# Patient Record
Sex: Male | Born: 1970
Health system: Southern US, Community
[De-identification: ages and names within clinical notes are randomized; demographics above are authoritative.]

## PROBLEM LIST (undated history)

## (undated) DIAGNOSIS — E785 Hyperlipidemia, unspecified: Secondary | ICD-10-CM

## (undated) DIAGNOSIS — T7840XA Allergy, unspecified, initial encounter: Secondary | ICD-10-CM

## (undated) DIAGNOSIS — I10 Essential (primary) hypertension: Secondary | ICD-10-CM

## (undated) HISTORY — DX: Essential (primary) hypertension: I10

## (undated) HISTORY — PX: NECK SURGERY: SHX720

## (undated) HISTORY — PX: COLONOSCOPY: SHX174

## (undated) HISTORY — DX: Allergy, unspecified, initial encounter: T78.40XA

## (undated) HISTORY — DX: Hyperlipidemia, unspecified: E78.5

---

## 2002-07-28 ENCOUNTER — Encounter: Payer: Self-pay | Admitting: Emergency Medicine

## 2002-07-28 ENCOUNTER — Emergency Department (HOSPITAL_COMMUNITY): Admission: EM | Admit: 2002-07-28 | Discharge: 2002-07-28 | Payer: Self-pay | Admitting: Emergency Medicine

## 2002-12-17 ENCOUNTER — Emergency Department (HOSPITAL_COMMUNITY): Admission: EM | Admit: 2002-12-17 | Discharge: 2002-12-17 | Payer: Self-pay | Admitting: *Deleted

## 2003-03-28 ENCOUNTER — Encounter: Payer: Self-pay | Admitting: Gastroenterology

## 2003-03-28 ENCOUNTER — Encounter: Admission: RE | Admit: 2003-03-28 | Discharge: 2003-03-28 | Payer: Self-pay | Admitting: Gastroenterology

## 2003-05-13 ENCOUNTER — Emergency Department (HOSPITAL_COMMUNITY): Admission: EM | Admit: 2003-05-13 | Discharge: 2003-05-14 | Payer: Self-pay | Admitting: Emergency Medicine

## 2003-06-07 ENCOUNTER — Encounter: Payer: Self-pay | Admitting: Gastroenterology

## 2003-06-07 ENCOUNTER — Encounter: Admission: RE | Admit: 2003-06-07 | Discharge: 2003-06-07 | Payer: Self-pay | Admitting: Gastroenterology

## 2003-07-24 ENCOUNTER — Encounter: Payer: Self-pay | Admitting: Gastroenterology

## 2003-07-24 ENCOUNTER — Encounter: Admission: RE | Admit: 2003-07-24 | Discharge: 2003-07-24 | Payer: Self-pay | Admitting: Gastroenterology

## 2003-08-10 ENCOUNTER — Encounter (INDEPENDENT_AMBULATORY_CARE_PROVIDER_SITE_OTHER): Payer: Self-pay | Admitting: Specialist

## 2003-08-10 ENCOUNTER — Ambulatory Visit (HOSPITAL_COMMUNITY): Admission: RE | Admit: 2003-08-10 | Discharge: 2003-08-10 | Payer: Self-pay | Admitting: Gastroenterology

## 2004-06-20 ENCOUNTER — Encounter: Admission: RE | Admit: 2004-06-20 | Discharge: 2004-06-20 | Payer: Self-pay | Admitting: Family Medicine

## 2004-09-18 ENCOUNTER — Ambulatory Visit (HOSPITAL_COMMUNITY): Admission: RE | Admit: 2004-09-18 | Discharge: 2004-09-18 | Payer: Self-pay | Admitting: Cardiology

## 2006-08-18 ENCOUNTER — Emergency Department (HOSPITAL_COMMUNITY): Admission: EM | Admit: 2006-08-18 | Discharge: 2006-08-18 | Payer: Self-pay | Admitting: Family Medicine

## 2008-08-29 ENCOUNTER — Emergency Department (HOSPITAL_COMMUNITY): Admission: EM | Admit: 2008-08-29 | Discharge: 2008-08-29 | Payer: Self-pay | Admitting: Emergency Medicine

## 2009-04-07 ENCOUNTER — Emergency Department (HOSPITAL_COMMUNITY): Admission: EM | Admit: 2009-04-07 | Discharge: 2009-04-07 | Payer: Self-pay | Admitting: Emergency Medicine

## 2009-07-04 ENCOUNTER — Inpatient Hospital Stay (HOSPITAL_COMMUNITY): Admission: RE | Admit: 2009-07-04 | Discharge: 2009-07-06 | Payer: Self-pay | Admitting: Neurosurgery

## 2009-10-03 ENCOUNTER — Ambulatory Visit (HOSPITAL_COMMUNITY): Admission: RE | Admit: 2009-10-03 | Discharge: 2009-10-03 | Payer: Self-pay | Admitting: Family Medicine

## 2010-06-24 IMAGING — CR DG CERVICAL SPINE 2 OR 3 VIEWS
1 series · 1 of 1 positions shown · non-contrast
Comparison: None.

CLINICAL DATA: C5-6 and C6-7 spondylosis and myelopathy. ACDF.

INTRAOPERATIVE CERVICAL SPINE - 2-3 VIEW 07/04/2009:

[view not recorded]
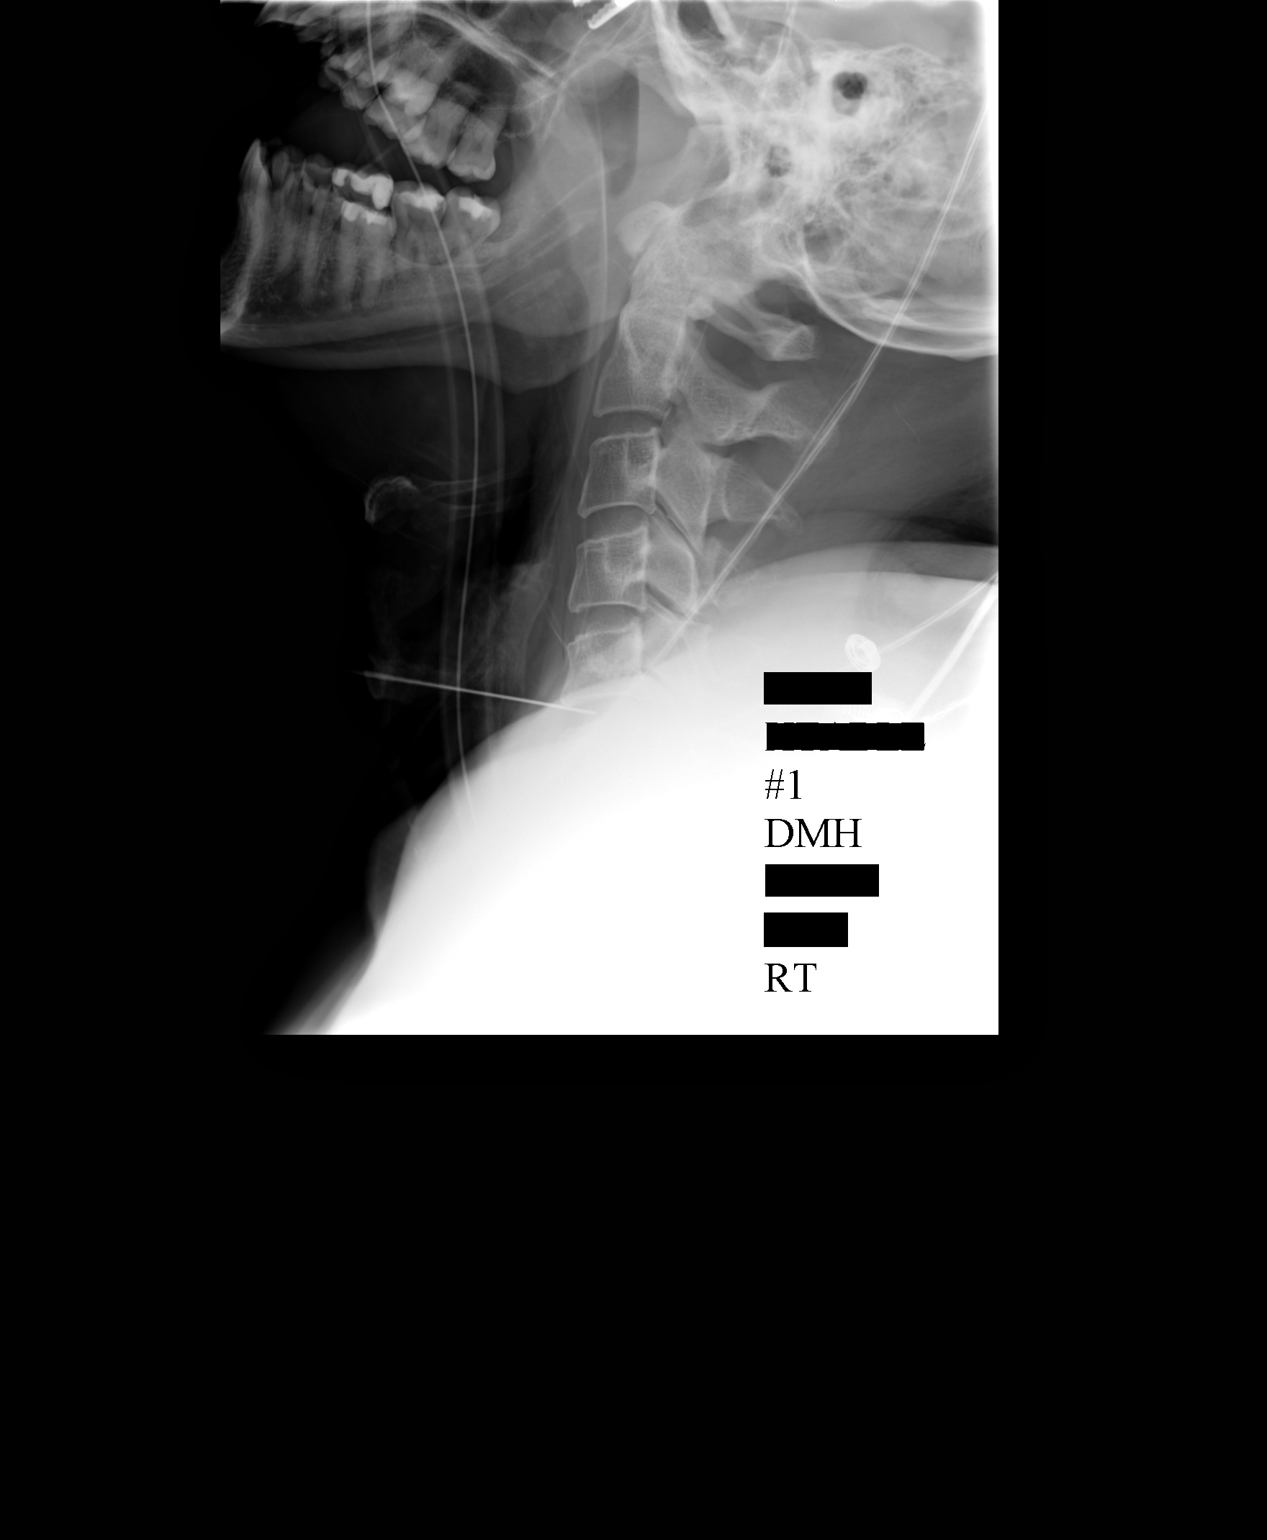

[1 of 1 positions shown; findings below may reference images not displayed]

FINDINGS: All images were submitted for interpretation
postoperatively.  Initial image obtained 1936 hours demonstrates
localizer needle projected over the C5-6 disc space.  The second
and third images obtained at 6764 and 5152 hours demonstrate ACDF
with hardware C5-C7.  Interbody fusion plugs appropriately
positioned in the disc spaces.
IMPRESSION: ACDF with hardware C5-C7.

## 2011-03-01 LAB — CBC
HCT: 46.6 % (ref 39.0–52.0)
MCHC: 35.1 g/dL (ref 30.0–36.0)
MCV: 89.6 fL (ref 78.0–100.0)
RBC: 5.2 MIL/uL (ref 4.22–5.81)

## 2011-03-04 LAB — POCT I-STAT, CHEM 8
HCT: 47 % (ref 39.0–52.0)
Hemoglobin: 16 g/dL (ref 13.0–17.0)
Potassium: 4 mEq/L (ref 3.5–5.1)
Sodium: 141 mEq/L (ref 135–145)
TCO2: 29 mmol/L (ref 0–100)

## 2011-03-04 LAB — DIFFERENTIAL
Basophils Absolute: 0 10*3/uL (ref 0.0–0.1)
Basophils Relative: 0 % (ref 0–1)
Eosinophils Absolute: 0 10*3/uL (ref 0.0–0.7)
Eosinophils Relative: 1 % (ref 0–5)
Lymphs Abs: 1.2 10*3/uL (ref 0.7–4.0)
Neutrophils Relative %: 76 % (ref 43–77)

## 2011-03-04 LAB — CBC
HCT: 47 % (ref 39.0–52.0)
MCHC: 34.7 g/dL (ref 30.0–36.0)
MCV: 89.1 fL (ref 78.0–100.0)
Platelets: 247 10*3/uL (ref 150–400)
RDW: 12.7 % (ref 11.5–15.5)
WBC: 7.3 10*3/uL (ref 4.0–10.5)

## 2011-03-04 LAB — POCT CARDIAC MARKERS
CKMB, poc: <1 ng/mL — ABNORMAL LOW (ref 1.0–8.0)
Myoglobin, poc: 60.7 ng/mL (ref 12–200)
Troponin i, poc: <0.05 ng/mL (ref 0.00–0.09)

## 2011-04-08 NOTE — Op Note (Signed)
NAMEIBRAHAM, LEVI              ACCOUNT NO.:  [id number]   MEDICAL RECORD NO.:  [id number]          PATIENT TYPE:  OIB   LOCATION:  3013                         FACILITY:  [facility]   PHYSICIAN:  Hilda Lias, M.D.   DATE OF BIRTH:  05-25-1971   DATE OF PROCEDURE:  07/04/2009  DATE OF DISCHARGE:                               OPERATIVE REPORT   PREOPERATIVE DIAGNOSES:  C5-C6, C6-C7 stenosis with early myelopathy,  chronic radiculopathy.   POSTOPERATIVE DIAGNOSES:  C5-C6, C6-C7 stenosis with early myelopathy,  chronic radiculopathy.   PROCEDURE:  1. Anterior C5-C6 and C6-C7 diskectomy.  2. Decompression of spinal cord.  3. Foraminotomy.  4. Interbody fusion with allograft and 7-mm with autograft plate.  5. Microscope.   SURGEON:  Hilda Lias, MD   ASSISTANT:  Clydene Fake, MD   CLINICAL HISTORY:  Mr. Hofmann is a 40 year old gentleman who had been  followed by me for 5 years with weakness and neck pain.  Lately, here  having some discomfort, pain in the arm, and difficulty walking.  The x-  rays shows stenosis at level of C5-6 and C6-7, and surgery was advised.   PROCEDURE:  The patient was taken to the OR and after intubation, the  left side of the neck was cleaned with DuraPrep.  Transverse incision  was made through the skin, subcutaneous tissue, platysma.  Dissection  was carried out all the way down to the cervical spine.  X-rays showed  that we were at the level of C5-6 then the anterior ligament of C5-6 and  C6-7 were opened and we brought the microscope in to the area.  At the  level of C5-6, we removed the disk.  We found that the patient had quite  a bit of osteophyte with some calcified posterior ligament.  Decompression of the ligament was done and then the spinal cord was  decompressed superoinferiorly and laterally into the foramina.  The same  procedure was done necessarily at the level of C5-6 and C6-7, and at the  end we have a good decompression of  the C6-7 nerve root.  The endplate  of C6-7 and C5-6 were trimmed.  We introduced two allografts with  autograft in size, 7 mm height, lordotic.  Then plate using serial  screws of 13 and 15 were inserted.  Lateral cervical spine showed good  position of the graft and the plate.  Hemostasis was accomplished with  bipolar.  At the end, we waited 10 minutes to make sure that we have a  complete hemostasis.  Once this was achieved, the wound was closed with  Vicryl and Steri-Strips.           ______________________________  Hilda Lias, M.D.     EB/MEDQ  D:  07/04/2009  T:  07/04/2009  Job:  [id number]

## 2011-04-08 NOTE — H&P (Signed)
Timothy Dean, Timothy Dean              ACCOUNT NO.:  [id number]   MEDICAL RECORD NO.:  [id number]          PATIENT TYPE:  OIB   LOCATION:  3013                         FACILITY:  [facility]   PHYSICIAN:  Hilda Lias, M.D.   DATE OF BIRTH:  [birth year]   DATE OF ADMISSION:  07/04/2009  DATE OF DISCHARGE:                              HISTORY & PHYSICAL   Mr. Timothy Dean is a gentleman who came complaining of neck pain  incoordination with the leg up to the point that he feels that both legs  are really heavy.  Pain and weakness in the upper extremity has started  with this.  This gentleman 4 years ago was seen by me, and I made a  diagnosis of cervical stenosis at the level C5-6, C6-7.  He declined  surgery, now he feeling that he is kind of worse.  He ended up in the  emergency room admitted with [place].   PAST MEDICAL HISTORY:  Negative.   ALLERGIES:  He is not allergic to any medications.   FAMILY HISTORY:  Unremarkable.   SOCIAL HISTORY:  Negative.   REVIEW OF SYSTEMS:  Positive for weakness in the arm, neck pain, and  back pain.   PHYSICAL EXAMINATION:  HEAD, EARS, NOSE, AND THROAT:  Normal.  NECK:  Normal.  LUNGS:  Clear.  HEART:  Heart sounds normal.  ABDOMEN:  Normal.  EXTREMITIES:  Normal pulses.  NEURO:  He has weakness in both biceps and both wrist extensors.  Reflexes are 2+.  No Babinski.   The cervical spine x-ray showed stenosis at the level C5-6, C6-7.  The  level C5-6 has progressed in relation to the one previous x-ray.   CLINICAL IMPRESSION:  C5-6, C6-7 stenosis with early myelopathy.   RECOMMENDATIONS:  The patient is being admitted for surgery.  Procedure  will be C5-6, C6-7 diskectomy and decompression.  He knows about the  risks such as infection, CSF leak, worsening pain, paralysis, need for  surgery.           ______________________________  Hilda Lias, M.D.     EB/MEDQ  D:  07/04/2009  T:  07/04/2009  Job:  [id number]

## 2011-04-11 NOTE — Op Note (Signed)
   NAME:  Timothy Dean, Timothy Dean                        ACCOUNT NO.:  [id number]   MEDICAL RECORD NO.:  [id number]                   PATIENT TYPE:  AMB   LOCATION:  ENDO                                 FACILITY:  [facility]   PHYSICIAN:  Petra Kuba, M.D.                 DATE OF BIRTH:  04-Sep-1971   DATE OF PROCEDURE:  08/10/2003  DATE OF DISCHARGE:                                 OPERATIVE REPORT   PROCEDURE PERFORMED:  Esophagogastroduodenoscopy with biopsy.   ENDOSCOPIST:  Petra Kuba, M.D.   INDICATIONS FOR PROCEDURE:  Patient with a history of ulcers and  Helicobacter pylori with abdominal pain not responding to the usual  medicines.  Consent was signed after the risks, benefits, methods and  options were thoroughly discussed in the office.   MEDICINES USED:  Demerol 70 mg, Versed 7 mg.   DESCRIPTION OF PROCEDURE:  The video endoscope was inserted by direct  vision.  Esophagus was normal.  Scope passed into the stomach and advanced  to the antrum.  Some very minimal antritis was seen and into a normal-  appearing duodenal bulb and around the C-loop to a normal-appearing second  portion of the duodenum.  We went ahead and took two or three biopsies of  the duodenum to rule out any microscopic abnormality.  Scope was withdrawn  back to the stomach which was evaluated on straight and retroflex  visualization.  Other than some minimal gastritis, no abnormalities were  seen including a good look at the cardia, fundus, angularis, lesser and  greater curve.  Two biopsies of the antrum, two of the proximal stomach were  obtained and put in a separate container to rule out Helicobacter pylori.  Air was suctioned, scope was slowly withdrawn.  Again, a good look at the  esophagus was normal.  Scope was removed.  The patient tolerated the  procedure well.  There were no obvious immediate complication.   ENDOSCOPIC DIAGNOSIS:  1. Minimal gastritis, status post biopsy.  2. Otherwise within  normal limits to the esophagogastroduodenoscopy, status     post duodenal biopsy as well.   PLAN:  Await pathology.  Consider laparoscope, consider Neurontin or  antidepressant.  Maybe resend him to Dr. Hal Hope to get her opinion of any  other work-up and plans and her take on the pain.  Possibly even repeat a CT  one more time.  I will see him back as needed or in six weeks to decide any  of the above.      MEM/MEDQ  D:  08/10/2003  T:  08/10/2003  Job:  [id number]   cc:   Clydie Braun L. Hal Hope, M.D.  [address] [place]  [place] [address]  Fax: [phone]

## 2011-08-25 LAB — POCT I-STAT, CHEM 8
BUN: 7
Calcium, Ion: 1.11 — ABNORMAL LOW
Chloride: 104
Creatinine, Ser: 0.9
Glucose, Bld: 122 — ABNORMAL HIGH
HCT: 43
Hemoglobin: 14.6
Potassium: 4.2
Sodium: 138
TCO2: 26

## 2011-08-25 LAB — GLUCOSE, CAPILLARY: Glucose-Capillary: 118 — ABNORMAL HIGH

## 2012-05-06 ENCOUNTER — Other Ambulatory Visit: Payer: Self-pay | Admitting: Family Medicine

## 2012-05-06 DIAGNOSIS — R1033 Periumbilical pain: Secondary | ICD-10-CM

## 2012-05-10 ENCOUNTER — Other Ambulatory Visit: Payer: Self-pay | Admitting: Family Medicine

## 2012-05-10 ENCOUNTER — Ambulatory Visit: Admission: RE | Admit: 2012-05-10 | Payer: 59 | Source: Ambulatory Visit

## 2012-05-10 ENCOUNTER — Ambulatory Visit
Admission: RE | Admit: 2012-05-10 | Discharge: 2012-05-10 | Disposition: A | Discharge status: Home / Self Care | Payer: 59 | Source: Ambulatory Visit | Attending: Family Medicine | Admitting: Family Medicine

## 2012-05-10 DIAGNOSIS — R1033 Periumbilical pain: Secondary | ICD-10-CM

## 2012-05-10 MED ORDER — IOHEXOL 300 MG/ML  SOLN
100.0000 mL | Freq: Once | INTRAMUSCULAR | Status: AC | PRN
  Administered 2012-05-10: 100 mL via INTRAVENOUS

## 2012-05-10 MED ORDER — IOHEXOL 300 MG/ML  SOLN
30.0000 mL | Freq: Once | INTRAMUSCULAR | Status: AC | PRN
  Administered 2012-05-10: 30 mL via ORAL

## 2013-03-24 ENCOUNTER — Ambulatory Visit (INDEPENDENT_AMBULATORY_CARE_PROVIDER_SITE_OTHER): Payer: 59 | Admitting: Family Medicine

## 2013-03-24 ENCOUNTER — Encounter: Payer: Self-pay | Admitting: Family Medicine

## 2013-03-24 VITALS — BP 160/90 | HR 90 | Temp 98.7°F | Resp 14 | Wt 182.0 lb

## 2013-03-24 DIAGNOSIS — N4889 Other specified disorders of penis: Secondary | ICD-10-CM

## 2013-03-24 DIAGNOSIS — R739 Hyperglycemia, unspecified: Secondary | ICD-10-CM

## 2013-03-24 DIAGNOSIS — N489 Disorder of penis, unspecified: Secondary | ICD-10-CM

## 2013-03-24 DIAGNOSIS — R7309 Other abnormal glucose: Secondary | ICD-10-CM

## 2013-03-24 DIAGNOSIS — R300 Dysuria: Secondary | ICD-10-CM

## 2013-03-24 LAB — WET PREP FOR TRICH, YEAST, CLUE
Clue Cells Wet Prep HPF POC: NONE SEEN
WBC, Wet Prep HPF POC: NONE SEEN
Yeast Wet Prep HPF POC: NONE SEEN

## 2013-03-24 LAB — BASIC METABOLIC PANEL
CO2: 25 mEq/L (ref 19–32)
Calcium: 9.3 mg/dL (ref 8.4–10.5)
Chloride: 107 mEq/L (ref 96–112)
Creat: 0.94 mg/dL (ref 0.50–1.35)
Sodium: 140 mEq/L (ref 135–145)

## 2013-03-24 LAB — HEMOGLOBIN A1C: Mean Plasma Glucose: 103 mg/dL (ref <117)

## 2013-03-24 NOTE — Progress Notes (Signed)
  Subjective:    Patient ID: Timothy Dean, male    DOB: 25-Dec-1970, 42 y.o.   MRN: [id number]  HPI Patient presents with pain and burning in his glans of the penis for over 1 year.  He does have some dysuria, but it burns and hurts even without voiding.  He denies penile discharge or rashes.  He has not been sexually active in years.  He denies any history of herpes.  There is no swelling, redness, or injury to the glans.  He also reports episodic blurred vision.  He denies diplopia.  He reports occasional floaters, wavy lines in his field of vision, and bright flashes of light in his field of vision.  Visual acuity is 20/15.  He has a history of hyperglycemia. No past medical history on file. No current outpatient prescriptions on file prior to visit.   No current facility-administered medications on file prior to visit.   No Known Allergies History   Social History  . Marital Status: Divorced    Spouse Name: N/A    Number of Children: N/A  . Years of Education: N/A   Occupational History  . Not on file.   Social History Main Topics  . Smoking status: Never Smoker   . Smokeless tobacco: Not on file  . Alcohol Use: No  . Drug Use: No  . Sexually Active: Not on file   Other Topics Concern  . Not on file   Social History Narrative  . No narrative on file        Review of Systems  All other systems reviewed and are negative.       Objective:   Physical Exam  Constitutional: He appears well-developed and well-nourished.  Cardiovascular: Normal rate, regular rhythm, normal heart sounds and intact distal pulses.  Exam reveals no gallop and no friction rub.   No murmur heard. Pulmonary/Chest: Effort normal and breath sounds normal. No respiratory distress. He has no wheezes. He has no rales. He exhibits no tenderness.  Abdominal: Soft. Bowel sounds are normal. He exhibits no distension. There is no tenderness. There is no rebound.  Genitourinary: Penis normal. No  penile tenderness.  Glans is normal.  There is no discharge, rahses, lesions, inguinal lymphadenopathy, or testicualr masses or pain on exam.        Assessment & Plan:  1. Dysuria Wet prep is negative.  Sent GC/CT.  U/A has been negative in past.  If GC/CT neg would consult urology for possible neuropathic pain. - GC/Chlamydia Probe Amp - WET PREP FOR TRICH, YEAST, CLUE  2. Penile pain See above. - GC/Chlamydia Probe Amp - WET PREP FOR TRICH, YEAST, CLUE  3. Hyperglycemia - Basic Metabolic Panel - Hemoglobin A1c If labs are normal, the differential for blurry vision is ocular migraines, macular degeneration, or PVD with retinal tears.  Recommended he consult his opthalmologist immediately.  He will.   I rechecked his BP at 140/90, told him to check daily and notify me in 1 week of the values.  He was very nervous on exam today.

## 2013-03-25 LAB — GC/CHLAMYDIA PROBE AMP: GC Probe RNA: NEGATIVE

## 2013-03-25 NOTE — Addendum Note (Signed)
Addended by: Lynnea Ferrier on: 03/25/2013 07:50 AM   Modules accepted: Orders

## 2014-04-07 ENCOUNTER — Ambulatory Visit (INDEPENDENT_AMBULATORY_CARE_PROVIDER_SITE_OTHER): Payer: 59 | Admitting: Family Medicine

## 2014-04-07 ENCOUNTER — Encounter: Payer: Self-pay | Admitting: Family Medicine

## 2014-04-07 VITALS — BP 130/80 | HR 100 | Temp 97.0°F | Resp 16 | Ht 70.5 in | Wt 181.0 lb

## 2014-04-07 DIAGNOSIS — R51 Headache: Secondary | ICD-10-CM

## 2014-04-07 DIAGNOSIS — H538 Other visual disturbances: Secondary | ICD-10-CM

## 2014-04-07 DIAGNOSIS — I10 Essential (primary) hypertension: Secondary | ICD-10-CM

## 2014-04-07 NOTE — Progress Notes (Signed)
   Subjective:    Patient ID: [name], male    DOB: May 18, 1971, 43 y.o.   MRN: [id number]  HPI Patient presents for a recheck of his blood pressure. His last office visit is found to have extremely elevated blood pressure greater than 160/90. He's been checking his blood pressure at home and finding it consistently greater than 140/90. He also complains of chronic dull left-sided headache which seems to be gradually worsening over the last 2 months. He is also having worsening blurry vision in his left eye. He was evaluated by an ophthalmologist who recommended glasses but did not see any pathology in the retina. He also has occasional vision changes in his right eye. The headache is nonpulsatile. He denies any photophobia or phonophobia. He does have some mild neck pain. No past medical history on file. No current outpatient prescriptions on file prior to visit.   No current facility-administered medications on file prior to visit.   No Known Allergies History   Social History  . Marital Status: Divorced    Spouse Name: N/A    Number of Children: N/A  . Years of Education: N/A   Occupational History  . Not on file.   Social History Main Topics  . Smoking status: Never Smoker   . Smokeless tobacco: Not on file  . Alcohol Use: No  . Drug Use: No  . Sexual Activity: Not on file   Other Topics Concern  . Not on file   Social History Narrative  . No narrative on file   No family history on file.    Review of Systems  All other systems reviewed and are negative.      Objective:   Physical Exam  Vitals reviewed. Constitutional: He is oriented to person, place, and time. He appears well-developed and well-nourished. No distress.  Eyes: Conjunctivae and EOM are normal. Pupils are equal, round, and reactive to light. Right eye exhibits no discharge. Left eye exhibits no discharge. No scleral icterus.  Neck: Neck supple.  Cardiovascular: Normal rate, regular rhythm  and normal heart sounds.   Pulmonary/Chest: Effort normal and breath sounds normal. No respiratory distress. He has no wheezes. He has no rales.  Abdominal: Soft. Bowel sounds are normal.  Neurological: He is alert and oriented to person, place, and time. He has normal reflexes. He displays normal reflexes. No cranial nerve deficit. He exhibits normal muscle tone. Coordination normal.  Skin: He is not diaphoretic.          Assessment & Plan:  1. HTN (hypertension) Begin bystolic 5 mg poqd and recheck in 2-3 weeks. 2. Headache(784.0) - CT Head W Contrast; Future  3. Blurry vision, left eye - CT Head W Contrast; Future  I believe the headache could be due to his blood pressure compounded by the blurry vision. I believe his blurry vision likely correct with glasses. I recommended he get the glasses his ophthalmologist recommended. However I will obtain a CT scan of the head to rule out a space occupying lesion inside the brain creating his blurry vision which is asymmetric and worsening along with the constant worsening heacache.

## 2014-04-10 ENCOUNTER — Other Ambulatory Visit: Payer: Self-pay | Admitting: Family Medicine

## 2014-04-10 DIAGNOSIS — R51 Headache: Secondary | ICD-10-CM

## 2014-04-10 DIAGNOSIS — H538 Other visual disturbances: Secondary | ICD-10-CM

## 2014-04-11 ENCOUNTER — Ambulatory Visit
Admission: RE | Admit: 2014-04-11 | Discharge: 2014-04-11 | Disposition: A | Discharge status: Home / Self Care | Payer: 59 | Source: Ambulatory Visit | Attending: Family Medicine | Admitting: Family Medicine

## 2014-04-11 DIAGNOSIS — H538 Other visual disturbances: Secondary | ICD-10-CM

## 2014-04-11 DIAGNOSIS — R51 Headache: Secondary | ICD-10-CM

## 2014-04-11 MED ORDER — IOHEXOL 300 MG/ML  SOLN
75.0000 mL | Freq: Once | INTRAMUSCULAR | Status: AC | PRN
  Administered 2014-04-11: 75 mL via INTRAVENOUS

## 2015-04-01 IMAGING — CT CT HEAD WO/W CM
1 of 2 series · 13 of 30 positions shown, 17 images · IV contrast (omnipaque)
Comparison: CT brain scan of 04/07/2009

CLINICAL DATA: Headache, blurred vision, lack of balance, no trauma

EXAM:
CT HEAD WITHOUT AND WITH CONTRAST
TECHNIQUE: Contiguous axial images were obtained from the base of the skull
through the vertex without and with intravenous contrast
CONTRAST:  75mL OMNIPAQUE IOHEXOL 300 MG/ML  SOLN

[Series 32: 3d filtered head w/o · axial · non-contrast · 0.46mm/px · z∈[+11,+156]mm · 13 of 32 slices shown, 17 images]
[im 3/32  brain]
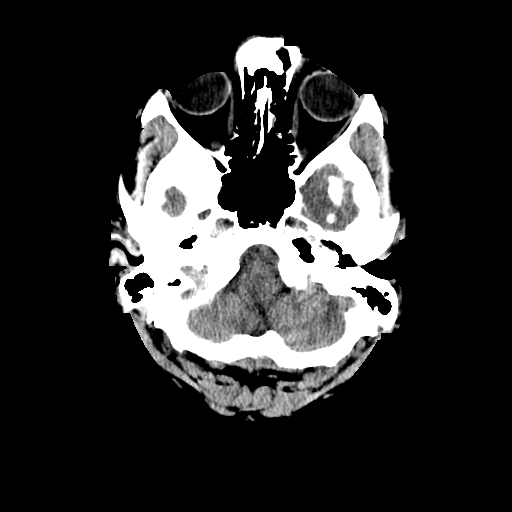
[im 3/32  bone]
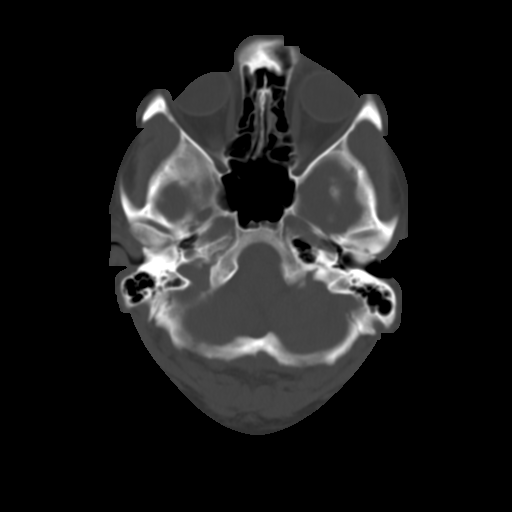
[im 5/32  brain]
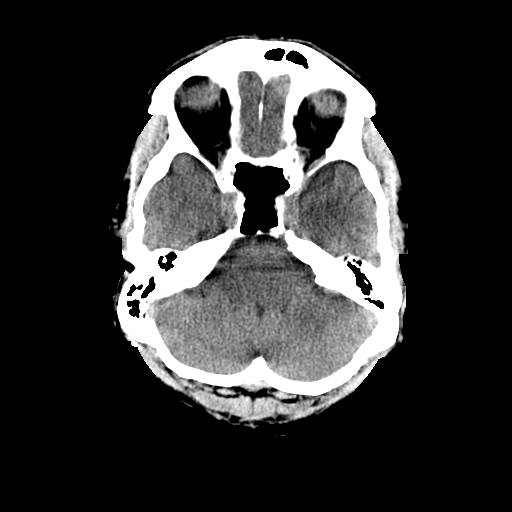
[im 7/32  brain]
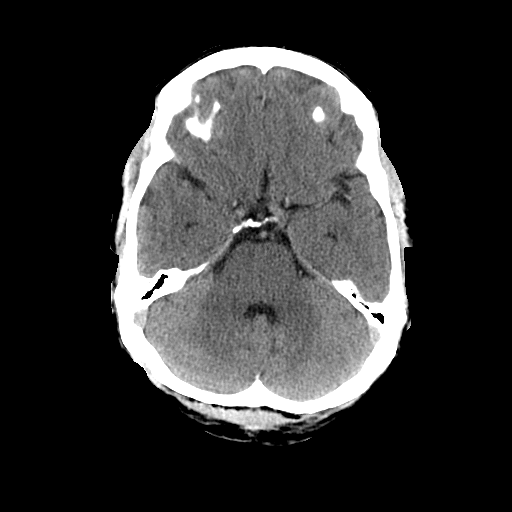
[im 9/32  brain]
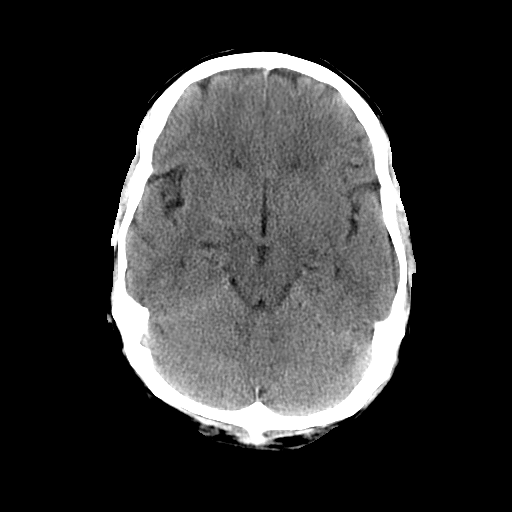
[im 12/32  brain]
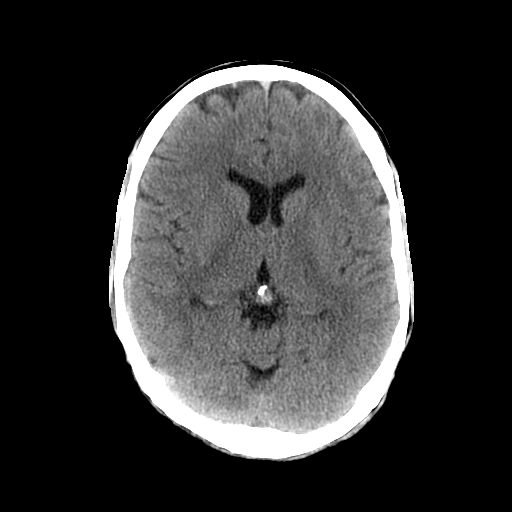
[im 12/32  bone]
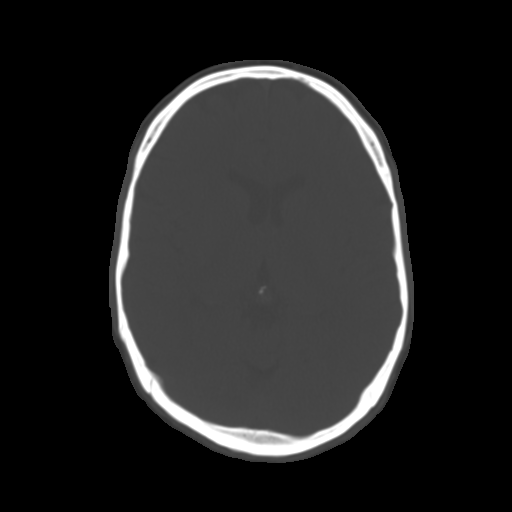
[im 14/32  brain]
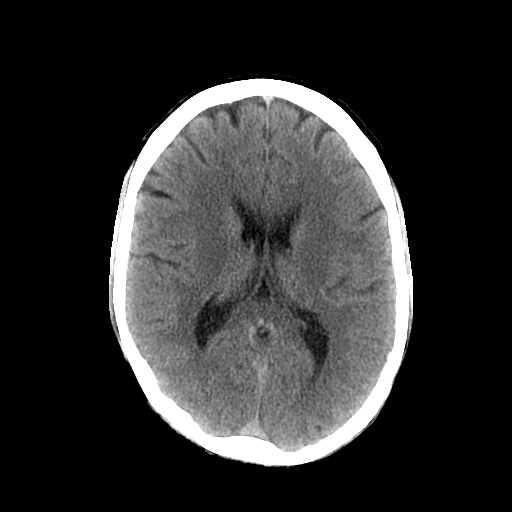
[im 16/32  brain]
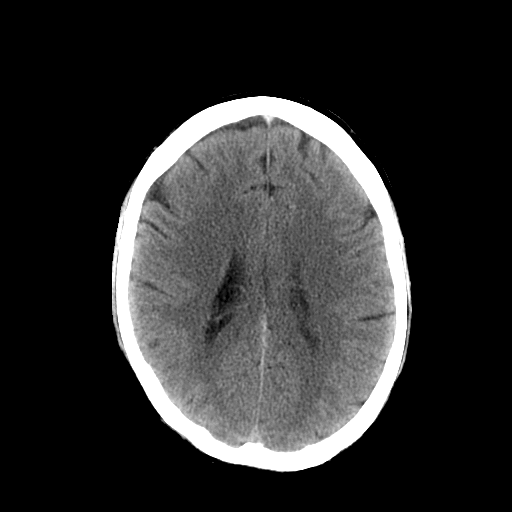
[im 18/32  brain]
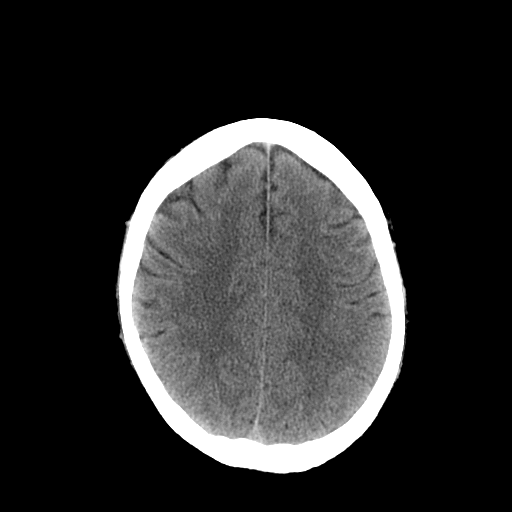
[im 20/32  brain]
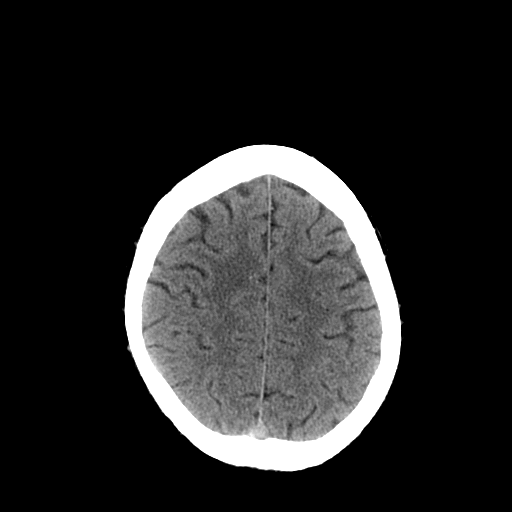
[im 20/32  bone]
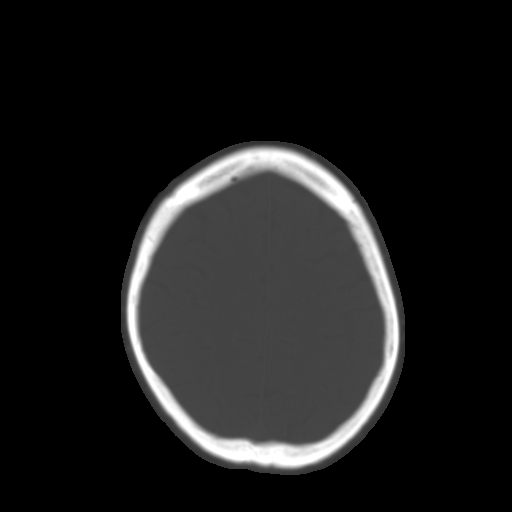
[im 23/32  brain]
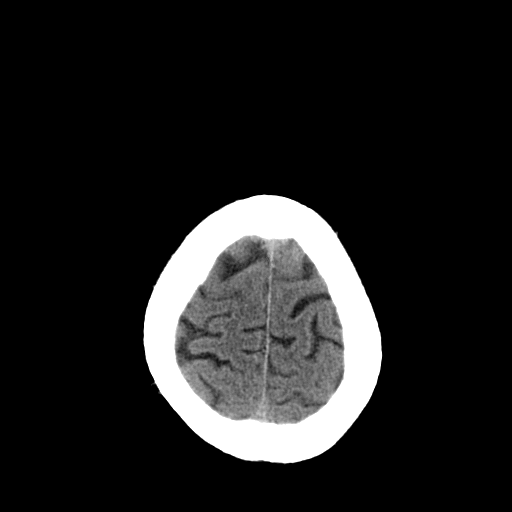
[im 25/32  brain]
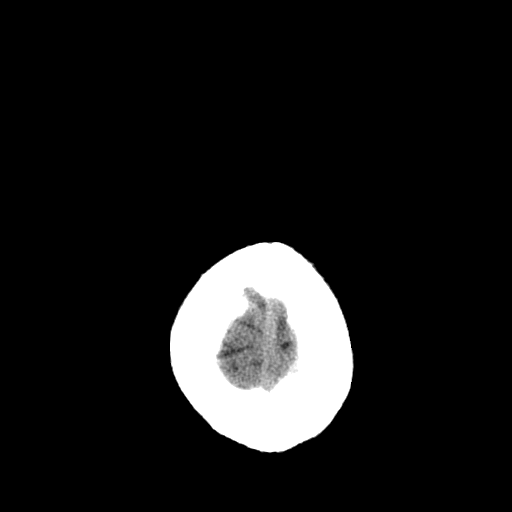
[im 27/32  brain]
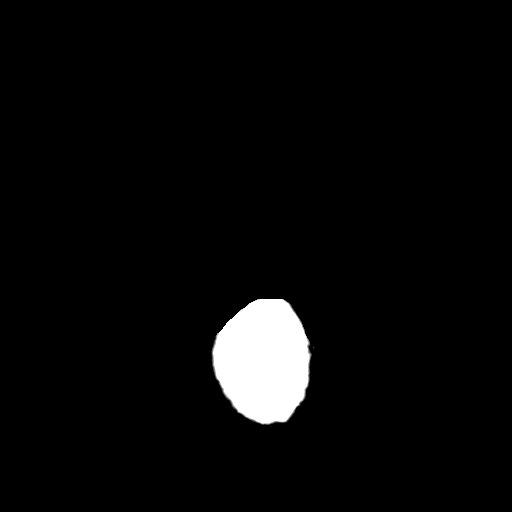
[im 29/32  brain]
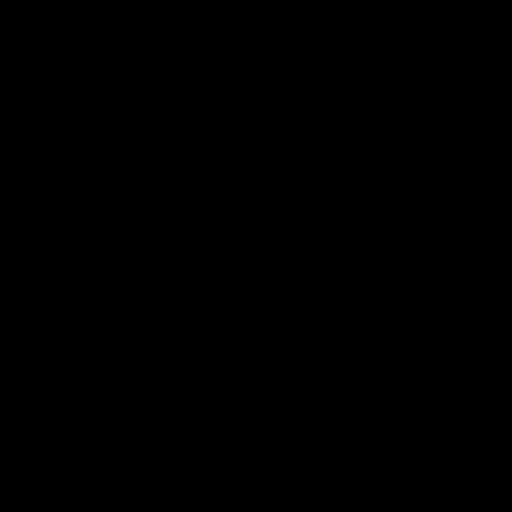
[im 29/32  bone]
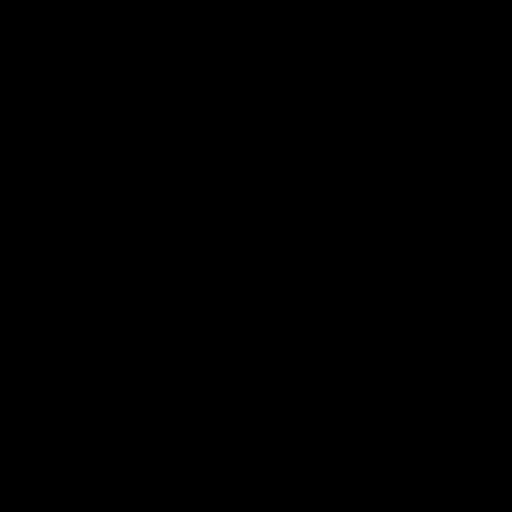

[13 of 30 positions shown; findings below may reference images not displayed]

FINDINGS: The ventricular system is stable in size and configuration, and the
septum is in a normal midline position. The fourth ventricle and
basilar cisterns appear normal. No mass, area of infarction, or
acute blood is seen. After contrast administration, no vascular
abnormality is seen. No enhancing lesion is noted. On bone window
images, no calvarial abnormality is seen. The paranasal sinuses are
pneumatized.
IMPRESSION: Negative CT of the brain.

## 2016-02-02 ENCOUNTER — Ambulatory Visit (INDEPENDENT_AMBULATORY_CARE_PROVIDER_SITE_OTHER): Payer: Commercial Managed Care - HMO | Admitting: Family Medicine

## 2016-02-02 VITALS — BP 152/100 | HR 122 | Temp 98.1°F | Resp 18 | Ht 69.0 in | Wt 194.4 lb

## 2016-02-02 DIAGNOSIS — I160 Hypertensive urgency: Secondary | ICD-10-CM

## 2016-02-02 LAB — POCT URINALYSIS DIP (MANUAL ENTRY)
BILIRUBIN UA: NEGATIVE
BILIRUBIN UA: NEGATIVE
Blood, UA: NEGATIVE
GLUCOSE UA: NEGATIVE
Leukocytes, UA: NEGATIVE
Nitrite, UA: NEGATIVE
Protein Ur, POC: NEGATIVE
SPEC GRAV UA: 1.015
Urobilinogen, UA: 0.2
pH, UA: 7

## 2016-02-02 LAB — BASIC METABOLIC PANEL
BUN: 10 mg/dL (ref 7–25)
CHLORIDE: 99 mmol/L (ref 98–110)
CO2: 25 mmol/L (ref 20–31)
Calcium: 9.8 mg/dL (ref 8.6–10.3)
Creat: 0.88 mg/dL (ref 0.60–1.35)
GLUCOSE: 99 mg/dL (ref 65–99)
POTASSIUM: 4.3 mmol/L (ref 3.5–5.3)
Sodium: 136 mmol/L (ref 135–146)

## 2016-02-02 MED ORDER — NEBIVOLOL HCL 5 MG PO TABS: 5.0000 mg | ORAL_TABLET | Freq: Every day | ORAL | Status: DC

## 2016-02-02 NOTE — Patient Instructions (Addendum)
IF you received an x-ray today, you will receive an invoice from [facility]. Please contact [facility] at [phone] with questions or concerns regarding your invoice.   IF you received labwork today, you will receive an invoice from [employer]. Please contact [facility] at [phone] with questions or concerns regarding your invoice.   Our billing staff will not be able to assist you with questions regarding bills from these companies.  You will be contacted with the lab results as soon as they are available. The fastest way to get your results is to activate your My Chart account. Instructions are located on the last page of this paperwork. If you have not heard from [place] regarding the results in 2 weeks, please contact this office.  DASH Eating Plan DASH stands for "Dietary Approaches to Stop Hypertension." The DASH eating plan is a healthy eating plan that has been shown to reduce high blood pressure (hypertension). Additional health benefits may include reducing the risk of type 2 diabetes mellitus, heart disease, and stroke. The DASH eating plan may also help with weight loss. WHAT DO I NEED TO KNOW ABOUT THE DASH EATING PLAN? For the DASH eating plan, you will follow these general guidelines:  Choose foods with a percent daily value for sodium of less than 5% (as listed on the food label).  Use salt-free seasonings or herbs instead of table salt or sea salt.  Check with your health care provider or pharmacist before using salt substitutes.  Eat lower-sodium products, often labeled as "lower sodium" or "no salt added."  Eat fresh foods.  Eat more vegetables, fruits, and low-fat dairy products.  Choose whole grains. Look for the word "whole" as the first word in the ingredient list.  Choose fish and skinless chicken or [place] more often than red meat. Limit fish, poultry, and meat to 6 oz (170 g) each day.  Limit sweets, desserts,  sugars, and sugary drinks.  Choose heart-healthy fats.  Limit cheese to 1 oz (28 g) per day.  Eat more home-cooked food and less restaurant, buffet, and fast food.  Limit fried foods.  Cook foods using methods other than frying.  Limit canned vegetables. If you do use them, rinse them well to decrease the sodium.  When eating at a restaurant, ask that your food be prepared with less salt, or no salt if possible. WHAT FOODS CAN I EAT? Seek help from a dietitian for individual calorie needs. Grains Whole grain or whole wheat bread. Brown rice. Whole grain or whole wheat pasta. Quinoa, bulgur, and whole grain cereals. Low-sodium cereals. Corn or whole wheat flour tortillas. Whole grain cornbread. Whole grain crackers. Low-sodium crackers. Vegetables Fresh or frozen vegetables (raw, steamed, roasted, or grilled). Low-sodium or reduced-sodium tomato and vegetable juices. Low-sodium or reduced-sodium tomato sauce and paste. Low-sodium or reduced-sodium canned vegetables.  Fruits All fresh, canned (in natural juice), or frozen fruits. Meat and Other Protein Products Ground beef (85% or leaner), grass-fed beef, or beef trimmed of fat. Skinless chicken or [place]. Ground chicken or [place]. Pork trimmed of fat. All fish and seafood. Eggs. Dried beans, peas, or lentils. Unsalted nuts and seeds. Unsalted canned beans. Dairy Low-fat dairy products, such as skim or 1% milk, 2% or reduced-fat cheeses, low-fat ricotta or cottage cheese, or plain low-fat yogurt. Low-sodium or reduced-sodium cheeses. Fats and Oils Tub margarines without trans fats. Light or reduced-fat mayonnaise and salad dressings (reduced sodium). Avocado. Safflower, olive, or canola oils. Natural peanut or almond butter.  Other Unsalted popcorn and pretzels. The items listed above may not be a complete list of recommended foods or beverages. Contact your dietitian for more options. WHAT FOODS ARE NOT RECOMMENDED? Grains White  bread. White pasta. White rice. Refined cornbread. Bagels and croissants. Crackers that contain trans fat. Vegetables Creamed or fried vegetables. Vegetables in a cheese sauce. Regular canned vegetables. Regular canned tomato sauce and paste. Regular tomato and vegetable juices. Fruits Dried fruits. Canned fruit in light or heavy syrup. Fruit juice. Meat and Other Protein Products Fatty cuts of meat. Ribs, chicken wings, bacon, sausage, bologna, salami, chitterlings, fatback, hot dogs, bratwurst, and packaged luncheon meats. Salted nuts and seeds. Canned beans with salt. Dairy Whole or 2% milk, cream, half-and-half, and cream cheese. Whole-fat or sweetened yogurt. Full-fat cheeses or blue cheese. Nondairy creamers and whipped toppings. Processed cheese, cheese spreads, or cheese curds. Condiments Onion and garlic salt, seasoned salt, table salt, and sea salt. Canned and packaged gravies. Worcestershire sauce. Tartar sauce. Barbecue sauce. Teriyaki sauce. Soy sauce, including reduced sodium. Steak sauce. Fish sauce. Oyster sauce. Cocktail sauce. Horseradish. Ketchup and mustard. Meat flavorings and tenderizers. Bouillon cubes. Hot sauce. Tabasco sauce. Marinades. Taco seasonings. Relishes. Fats and Oils Butter, stick margarine, lard, shortening, ghee, and bacon fat. Coconut, palm kernel, or palm oils. Regular salad dressings. Other Pickles and olives. Salted popcorn and pretzels. The items listed above may not be a complete list of foods and beverages to avoid. Contact your dietitian for more information. WHERE CAN I FIND MORE INFORMATION? National Heart, Lung, and Blood Institute: travelstabloid.com   This information is not intended to replace advice given to you by your health care provider. Make sure you discuss any questions you have with your health care provider.   Document Released: 10/30/2011 Document Revised: 12/01/2014 Document Reviewed:  09/14/2013 Elsevier Interactive Patient Education 2016 [place] Your High Blood Pressure Blood pressure is a measurement of how forceful your blood is pressing against the walls of the arteries. Arteries are muscular tubes within the circulatory system. Blood pressure does not stay the same. Blood pressure rises when you are active, excited, or nervous; and it lowers during sleep and relaxation. If the numbers measuring your blood pressure stay above normal most of the time, you are at risk for health problems. High blood pressure (hypertension) is a long-term (chronic) condition in which blood pressure is elevated. A blood pressure reading is recorded as two numbers, such as 120 over 80 (or 120/80). The first, higher number is called the systolic pressure. It is a measure of the pressure in your arteries as the heart beats. The second, lower number is called the diastolic pressure. It is a measure of the pressure in your arteries as the heart relaxes between beats.  Keeping your blood pressure in a normal range is important to your overall health and prevention of health problems, such as heart disease and stroke. When your blood pressure is uncontrolled, your heart has to work harder than normal. High blood pressure is a very common condition in adults because blood pressure tends to rise with age. Men and women are equally likely to have hypertension but at different times in life. Before age 84, men are more likely to have hypertension. After 46 years of age, women are more likely to have it. Hypertension is especially common in African Americans. This condition often has no signs or symptoms. The cause of the condition is usually not known. Your caregiver can help you come up with  a plan to keep your blood pressure in a normal, healthy range. BLOOD PRESSURE STAGES Blood pressure is classified into four stages: normal, prehypertension, stage 1, and stage 2. Your blood pressure reading will  be used to determine what type of treatment, if any, is necessary. Appropriate treatment options are tied to these four stages:  Normal  Systolic pressure (mm Hg): below 120.  Diastolic pressure (mm Hg): below 80. Prehypertension  Systolic pressure (mm Hg): 120 to 139.  Diastolic pressure (mm Hg): 80 to 89. Stage1  Systolic pressure (mm Hg): 140 to 159.  Diastolic pressure (mm Hg): 90 to 99. Stage2  Systolic pressure (mm Hg): 160 or above.  Diastolic pressure (mm Hg): 100 or above. RISKS RELATED TO HIGH BLOOD PRESSURE Managing your blood pressure is an important responsibility. Uncontrolled high blood pressure can lead to:  A heart attack.  A stroke.  A weakened blood vessel (aneurysm).  Heart failure.  Kidney damage.  Eye damage.  Metabolic syndrome.  Memory and concentration problems. HOW TO MANAGE YOUR BLOOD PRESSURE Blood pressure can be managed effectively with lifestyle changes and medicines (if needed). Your caregiver will help you come up with a plan to bring your blood pressure within a normal range. Your plan should include the following: Education  Read all information provided by your caregivers about how to control blood pressure.  Educate yourself on the latest guidelines and treatment recommendations. New research is always being done to further define the risks and treatments for high blood pressure. Lifestylechanges  Control your weight.  Avoid smoking.  Stay physically active.  Reduce the amount of salt in your diet.  Reduce stress.  Control any chronic conditions, such as high cholesterol or diabetes.  Reduce your alcohol intake. Medicines  Several medicines (antihypertensive medicines) are available, if needed, to bring blood pressure within a normal range. Communication  Review all the medicines you take with your caregiver because there may be side effects or interactions.  Talk with your caregiver about your diet, exercise  habits, and other lifestyle factors that may be contributing to high blood pressure.  See your caregiver regularly. Your caregiver can help you create and adjust your plan for managing high blood pressure. RECOMMENDATIONS FOR TREATMENT AND FOLLOW-UP  The following recommendations are based on current guidelines for managing high blood pressure in nonpregnant adults. Use these recommendations to identify the proper follow-up period or treatment option based on your blood pressure reading. You can discuss these options with your caregiver.  Systolic pressure of 123456 to [id number] or diastolic pressure of 80 to 89: Follow up with your caregiver as directed.  Systolic pressure of XX123456 to 0000000 or diastolic pressure of 90 to 100: Follow up with your caregiver within 2 months.  Systolic pressure above 0000000 or diastolic pressure above 123XX123: Follow up with your caregiver within 1 month.  Systolic pressure above 99991111 or diastolic pressure above A999333: Consider antihypertensive therapy; follow up with your caregiver within 1 week.  Systolic pressure above A999333 or diastolic pressure above 123456: Begin antihypertensive therapy; follow up with your caregiver within 1 week.   This information is not intended to replace advice given to you by your health care provider. Make sure you discuss any questions you have with your health care provider.   Document Released: 08/04/2012 Document Reviewed: 08/04/2012 Elsevier Interactive Patient Education [employer].

## 2016-02-02 NOTE — Progress Notes (Signed)
Subjective:   By signing my name below, I, Timothy Dean, attest that this documentation has been prepared under the direction and in the presence of Delman Cheadle, MD.  Electronically Signed: Thea Alken, ED Scribe. 02/02/2016. 3:41 PM.   Patient ID: Timothy Dean, male    DOB: 01/06/1971, 45 y.o.   MRN: [id number]  HPI Chief Complaint  Patient presents with  . Hypertension    noticed daily headaches all week & felt sluggish. Check BP this morning and it was 168/107 this morning (had a headache at the time), 155/101 about 30 min later    HPI Comments: Timothy Dean is a 45 y.o. male who presents to the [facility] complaining of headaches that began a couple weeks ago. He reports associated blurry vision noticed this week. He has a BP cuff at home and when he checked his BP this morning it was elevated. He states he has been borderline hypertensive in the past but was never put on medication. Pt has been taking advil every other day for a couple weeks for HA. He was prescribed glasses in the past but does not wear them.  He drinks about 4 sodas a day but has not had any today. He denies heart fluttering or racing, CP, leg swelling, trouble breathing and urinary symptoms.    Pt was last seen 2 years by PCP for similar symptoms. BP at that time was 160/90 with headache. HR 100. He was started on bystolic 5mg  and advised to recheck in 2 weeks. Recommended to start wearing glasses that were prescribed. CT of head due to headaches which was normal and renal function os normal   There are no active problems to display for this patient.  No past medical history on file. Past Surgical History  Procedure Laterality Date  . Neck surgery     No Known Allergies Prior to Admission medications   Not on File   Social History   Social History  . Marital Status: Divorced    Spouse Name: N/A  . Number of Children: N/A  . Years of Education: N/A   Occupational History  . Not  on file.   Social History Main Topics  . Smoking status: Never Smoker   . Smokeless tobacco: Not on file  . Alcohol Use: No  . Drug Use: No  . Sexual Activity: Not on file   Other Topics Concern  . Not on file   Social History Narrative   Review of Systems  Constitutional: Positive for fatigue.  Eyes: Positive for visual disturbance.  Respiratory: Negative for apnea and shortness of breath.   Cardiovascular: Negative for chest pain, palpitations and leg swelling.  Genitourinary: Negative for dysuria, urgency, frequency and difficulty urinating.  Neurological: Positive for headaches.    Objective:   Physical Exam  Constitutional: He is oriented to person, place, and time. He appears well-developed and well-nourished. No distress.  HENT:  Head: Normocephalic and atraumatic.  Eyes: Conjunctivae and EOM are normal.  Neck: Neck supple.  Cardiovascular: Regular rhythm, S1 normal and S2 normal.  Tachycardia present.   No murmur heard. Pulmonary/Chest: Effort normal.  Musculoskeletal: Normal range of motion.  Neurological: He is alert and oriented to person, place, and time.  Skin: Skin is warm and dry.  Psychiatric: He has a normal mood and affect. His behavior is normal.  Nursing note and vitals reviewed.  Filed Vitals:   02/02/16 1449  BP: 152/100  Pulse: 122  Temp:  98.1 F (36.7 C)  TempSrc: Oral  Resp: 18  Height: 5\' 9"  (1.753 m)  Weight: 194 lb 6 oz (88.168 kg)  SpO2: 98%    Results for orders placed or performed in visit on 02/02/16  POCT urinalysis dipstick  Result Value Ref Range   Color, UA yellow yellow   Clarity, UA clear clear   Glucose, UA negative negative   Bilirubin, UA negative negative   Ketones, POC UA negative negative   Spec Grav, UA 1.015    Blood, UA negative negative   pH, UA 7.0    Protein Ur, POC negative negative   Urobilinogen, UA 0.2    Nitrite, UA Negative Negative   Leukocytes, UA Negative Negative     Visual Acuity  Screening   Right eye Left eye Both eyes  Without correction: 20/30 20/20 20/20   With correction:      EKG- sinus tachycardia. No acute ischemic change.  Assessment & Plan:   1. Hypertensive urgency   No side effects to bystolic prior and was effective so will retry - pt encouraged to make tlc changes to avoid the need for additional agents.  Orders Placed This Encounter  Procedures  . Basic metabolic panel    Order Specific Question:  Has the patient fasted?    Answer:  No  . POCT urinalysis dipstick  . EKG 12-Lead    Meds ordered this encounter  Medications  . nebivolol (BYSTOLIC) 5 MG tablet    Sig: Take 1 tablet (5 mg total) by mouth daily.    Dispense:  30 tablet    Refill:  0    I personally performed the services described in this documentation, which was scribed in my presence. The recorded information has been reviewed and considered, and addended by me as needed.  Delman Cheadle, MD MPH

## 2016-02-04 ENCOUNTER — Encounter: Payer: Self-pay | Admitting: Family Medicine

## 2016-03-03 ENCOUNTER — Encounter: Payer: Self-pay | Admitting: Family Medicine

## 2016-03-03 ENCOUNTER — Ambulatory Visit (INDEPENDENT_AMBULATORY_CARE_PROVIDER_SITE_OTHER): Payer: Commercial Managed Care - HMO | Admitting: Family Medicine

## 2016-03-03 VITALS — BP 118/80 | HR 82 | Temp 98.2°F | Resp 18 | Ht 70.5 in | Wt 187.0 lb

## 2016-03-03 DIAGNOSIS — I10 Essential (primary) hypertension: Secondary | ICD-10-CM | POA: Diagnosis not present

## 2016-03-03 MED ORDER — NEBIVOLOL HCL 5 MG PO TABS: 5.0000 mg | ORAL_TABLET | Freq: Every day | ORAL | Status: DC

## 2016-03-03 NOTE — Progress Notes (Signed)
Subjective:    Patient ID: Timothy Dean, male    DOB: 1971/06/21, 45 y.o.   MRN: [id number]  HPI 03/2014 Patient presents for a recheck of his blood pressure. His last office visit is found to have extremely elevated blood pressure greater than 160/90. He's been checking his blood pressure at home and finding it consistently greater than 140/90. He also complains of chronic dull left-sided headache which seems to be gradually worsening over the last 2 months. He is also having worsening blurry vision in his left eye. He was evaluated by an ophthalmologist who recommended glasses but did not see any pathology in the retina. He also has occasional vision changes in his right eye. The headache is nonpulsatile. He denies any photophobia or phonophobia. He does have some mild neck pain.  At that time, my plan was: 1. HTN (hypertension) Begin bystolic 5 mg poqd and recheck in 2-3 weeks. 2. Headache(784.0) - CT Head W Contrast; Future  3. Blurry vision, left eye - CT Head W Contrast; Future  I believe the headache could be due to his blood pressure compounded by the blurry vision. I believe his blurry vision likely correct with glasses. I recommended he get the glasses his ophthalmologist recommended. However I will obtain a CT scan of the head to rule out a space occupying lesion inside the brain creating his blurry vision which is asymmetric and worsening along with the constant worsening heacache.  03/03/16 Patient has not been seen since. He is here today for recheck. Was recently seen in urgent care for hypertensive urgency by Dr. Brigitte Pulse who recommended he restart bystolic.  BP there was 150/100.  Heart rate was also elevated at >120 bpm.  Since resuming the medication, his blood pressure has been averaging 130/80-85. He denies any side effects on the medication. Today his blood pressures well controlled. His headache has improved. He continues to have blurry vision in his left eye that he is still  not seen the eye doctor for his glasses. He is long overdue for a complete physical exam. No past medical history on file. Current Outpatient Prescriptions on File Prior to Visit  Medication Sig Dispense Refill  . nebivolol (BYSTOLIC) 5 MG tablet Take 1 tablet (5 mg total) by mouth daily. 30 tablet 0   No current facility-administered medications on file prior to visit.   No Known Allergies Social History   Social History  . Marital Status: Divorced    Spouse Name: N/A  . Number of Children: N/A  . Years of Education: N/A   Occupational History  . Not on file.   Social History Main Topics  . Smoking status: Never Smoker   . Smokeless tobacco: Not on file  . Alcohol Use: No  . Drug Use: No  . Sexual Activity: Not on file   Other Topics Concern  . Not on file   Social History Narrative   Family History  Problem Relation Age of Onset  . Diabetes Father   . Hyperlipidemia Father       Review of Systems  All other systems reviewed and are negative.      Objective:   Physical Exam  Constitutional: He is oriented to person, place, and time. He appears well-developed and well-nourished. No distress.  Eyes: Conjunctivae and EOM are normal. Pupils are equal, round, and reactive to light. Right eye exhibits no discharge. Left eye exhibits no discharge. No scleral icterus.  Neck: Neck supple.  Cardiovascular: Normal rate, regular  rhythm and normal heart sounds.   Pulmonary/Chest: Effort normal and breath sounds normal. No respiratory distress. He has no wheezes. He has no rales.  Abdominal: Soft. Bowel sounds are normal.  Neurological: He is alert and oriented to person, place, and time. He has normal reflexes. No cranial nerve deficit. He exhibits normal muscle tone. Coordination normal.  Skin: He is not diaphoretic.  Vitals reviewed.         Assessment & Plan:  Essential hypertension - Plan: nebivolol (BYSTOLIC) 5 MG tablet  Continue bystolic 5 mg a day. I gave  the patient to read a car to make his prescription $30 for 3 months. I would like the patient to schedule a complete physical exam as soon as possible so that we may check a CBC, CMP, fasting lipid panel and perform a thorough physical exam. However unfortunately his blood pressure now is much better. Also strongly encouraged the patient follow-up with ophthalmologist as recommended 2 years ago.

## 2016-03-11 ENCOUNTER — Ambulatory Visit: Payer: Commercial Managed Care - HMO

## 2016-03-11 ENCOUNTER — Other Ambulatory Visit: Payer: Self-pay | Admitting: Family Medicine

## 2016-03-11 DIAGNOSIS — I10 Essential (primary) hypertension: Secondary | ICD-10-CM

## 2016-03-11 DIAGNOSIS — Z79899 Other long term (current) drug therapy: Secondary | ICD-10-CM

## 2016-03-11 LAB — LIPID PANEL
CHOLESTEROL: 172 mg/dL (ref 125–200)
HDL: 34 mg/dL — AB (ref >=40)
LDL Cholesterol: 114 mg/dL (ref <130)
TRIGLYCERIDES: 120 mg/dL (ref <150)
Total CHOL/HDL Ratio: 5.1 Ratio — ABNORMAL HIGH (ref <=5.0)
VLDL: 24 mg/dL (ref <30)

## 2016-03-11 LAB — CBC WITH DIFFERENTIAL/PLATELET
BASOS ABS: 52 {cells}/uL (ref 0–200)
Basophils Relative: 1 %
EOS ABS: 156 {cells}/uL (ref 15–500)
Eosinophils Relative: 3 %
HEMATOCRIT: 47.7 % (ref 38.5–50.0)
HEMOGLOBIN: 16 g/dL (ref 13.0–17.0)
LYMPHS ABS: 1612 {cells}/uL (ref 850–3900)
Lymphocytes Relative: 31 %
MCH: 30 pg (ref 27.0–33.0)
MCHC: 33.5 g/dL (ref 32.0–36.0)
MCV: 89.3 fL (ref 80.0–100.0)
MONO ABS: 572 {cells}/uL (ref 200–950)
MPV: 8.6 fL (ref 7.5–12.5)
Monocytes Relative: 11 %
NEUTROS PCT: 54 %
Neutro Abs: 2808 cells/uL (ref 1500–7800)
Platelets: 245 10*3/uL (ref 140–400)
RBC: 5.34 MIL/uL (ref 4.20–5.80)
RDW: 13.8 % (ref 11.0–15.0)
WBC: 5.2 10*3/uL (ref 3.8–10.8)

## 2016-03-11 LAB — COMPLETE METABOLIC PANEL WITH GFR
ALBUMIN: 4.5 g/dL (ref 3.6–5.1)
ALK PHOS: 66 U/L (ref 40–115)
ALT: 18 U/L (ref 9–46)
AST: 21 U/L (ref 10–40)
BILIRUBIN TOTAL: 0.6 mg/dL (ref 0.2–1.2)
BUN: 14 mg/dL (ref 7–25)
CO2: 27 mmol/L (ref 20–31)
Calcium: 9 mg/dL (ref 8.6–10.3)
Chloride: 104 mmol/L (ref 98–110)
Creat: 0.92 mg/dL (ref 0.60–1.35)
GFR, Est African American: >89 mL/min (ref >=60)
Glucose, Bld: 98 mg/dL (ref 70–99)
POTASSIUM: 3.9 mmol/L (ref 3.5–5.3)
SODIUM: 141 mmol/L (ref 135–146)
Total Protein: 7.2 g/dL (ref 6.1–8.1)

## 2016-03-13 ENCOUNTER — Encounter: Payer: Self-pay | Admitting: *Deleted

## 2016-10-06 ENCOUNTER — Encounter: Payer: Self-pay | Admitting: Family Medicine

## 2016-10-06 ENCOUNTER — Ambulatory Visit (INDEPENDENT_AMBULATORY_CARE_PROVIDER_SITE_OTHER): Payer: Commercial Managed Care - HMO | Admitting: Family Medicine

## 2016-10-06 DIAGNOSIS — I10 Essential (primary) hypertension: Secondary | ICD-10-CM | POA: Diagnosis not present

## 2016-10-06 MED ORDER — NEBIVOLOL HCL 10 MG PO TABS: 10.0000 mg | ORAL_TABLET | Freq: Every day | ORAL | 3 refills | Status: DC

## 2016-10-06 NOTE — Progress Notes (Signed)
   Subjective:    Patient ID: Timothy Dean, male    DOB: [birth year], 45 y.o.   MRN: [id number]  Patient presents for Blood Pressure Issues (140's/ 90's average- has been checking it QW- does not have readings- using automatic arm cuff ) Clear to follow up his blood pressure. He was last seen in April 2017 hip into an urgent care at that time had hypertensive urgency he was restarted on bystolic his heart rate was also elevated greater than 120 at that time.  Blood pressure had improved by that visit and he was continued on 5 mg once a day. He has not followed up since then. He did have labs done in April which showed an LDL of 99991111, normal metabolic panel with preserved renal function And a normal CBC. His home readings have been 140-160/ 90's, no CP, HA, SOB, he has been taking medication as prescribed  His previous labs also reviewed with him at bedside   Review Of Systems:  GEN- denies fatigue, fever, weight loss,weakness, recent illness HEENT- denies eye drainage, change in vision, nasal discharge, CVS- denies chest pain, palpitations RESP- denies SOB, cough, wheeze ABD- denies N/V, change in stools, abd pain GU- denies dysuria, hematuria, dribbling, incontinence MSK- denies joint pain, muscle aches, injury Neuro- denies headache, dizziness, syncope, seizure activity       Objective:    BP (!) 146/92 (BP Location: Left Arm, Patient Position: Sitting, Cuff Size: Large)   Pulse 80   Temp 98.1 F (36.7 C) (Oral)   Resp 14   Ht 5' 10.5" (1.791 m)   Wt 183 lb (83 kg)   SpO2 98%   BMI 25.89 kg/m  GEN- NAD, alert and oriented x3 HEENT- PERRL, EOMI, non injected sclera, pink conjunctiva, MMM, oropharynx clear Neck- Supple, no thyromegaly CVS- RRR, no murmur RESP-CTAB EXT- No edema Pulses- Radial, DP- 2+    Left arm 140/88     Assessment & Plan:      Problem List Items Addressed This Visit    Benign essential HTN    Uncontrolled, check BMET Increase bystolic to  10mg  F/u by phone in 2 weeks to ensure this is controlled, before making any additional changes       Relevant Medications   nebivolol (BYSTOLIC) 10 MG tablet    Other Visit Diagnoses    Essential hypertension       Relevant Medications   nebivolol (BYSTOLIC) 10 MG tablet   Other Relevant Orders   Basic metabolic panel      Note: This dictation was prepared with Dragon dictation along with smaller phrase technology. Any transcriptional errors that result from this process are unintentional.

## 2016-10-06 NOTE — Patient Instructions (Signed)
Take 10mg  of bystolic once a day Record BP readings We will call in 2 weeks to see how meds are doing We will call with kidney function  Schedule physical for April with Dr.Pickard

## 2016-10-06 NOTE — Assessment & Plan Note (Signed)
Uncontrolled, check BMET Increase bystolic to 10mg  F/u by phone in 2 weeks to ensure this is controlled, before making any additional changes

## 2016-10-07 LAB — BASIC METABOLIC PANEL
BUN: 11 mg/dL (ref 7–25)
CALCIUM: 9.3 mg/dL (ref 8.6–10.3)
CO2: 26 mmol/L (ref 20–31)
CREATININE: 1.01 mg/dL (ref 0.60–1.35)
Chloride: 102 mmol/L (ref 98–110)
GLUCOSE: 145 mg/dL — AB (ref 70–99)
Potassium: 4.1 mmol/L (ref 3.5–5.3)
Sodium: 139 mmol/L (ref 135–146)

## 2016-10-24 ENCOUNTER — Telehealth: Payer: Self-pay | Admitting: Family Medicine

## 2016-10-24 DIAGNOSIS — I10 Essential (primary) hypertension: Secondary | ICD-10-CM

## 2016-10-24 NOTE — Telephone Encounter (Signed)
Patient was given bystolic for her bp last visit, would like a higher dosage called into the pharmacy if possible please call with questions to [phone]

## 2016-10-24 NOTE — Telephone Encounter (Signed)
Patient uses [facility] [address] he is completely out of the 10 mg bystolic.

## 2016-10-27 MED ORDER — NEBIVOLOL HCL 10 MG PO TABS: 10.0000 mg | ORAL_TABLET | Freq: Every day | ORAL | 3 refills | Status: DC

## 2016-10-27 NOTE — Telephone Encounter (Signed)
Continue the bystolic 10mg , send over new script 90 day supply is fine

## 2016-10-27 NOTE — Telephone Encounter (Signed)
Patient called back states that since he has been taking the 10mg  his readings have been between 135/82 and 136/84. Patient prefers a 3 month supply. CB# [phone]

## 2016-10-27 NOTE — Telephone Encounter (Signed)
[facility] - need readings to determine what changes need to be made.

## 2016-10-27 NOTE — Telephone Encounter (Signed)
Patient aware of providers recommendations. Medication called/sent to requested pharmacy  

## 2016-10-27 NOTE — Telephone Encounter (Signed)
Do you want to change his medications or leave him on 10mg  of the Bystolic

## 2017-04-06 ENCOUNTER — Ambulatory Visit (INDEPENDENT_AMBULATORY_CARE_PROVIDER_SITE_OTHER): Payer: Commercial Managed Care - HMO | Admitting: Family Medicine

## 2017-04-06 ENCOUNTER — Encounter: Payer: Self-pay | Admitting: Family Medicine

## 2017-04-06 VITALS — BP 126/80 | HR 64 | Temp 98.2°F | Resp 16 | Ht 70.5 in | Wt 179.0 lb

## 2017-04-06 DIAGNOSIS — G43109 Migraine with aura, not intractable, without status migrainosus: Secondary | ICD-10-CM | POA: Diagnosis not present

## 2017-04-06 DIAGNOSIS — R5382 Chronic fatigue, unspecified: Secondary | ICD-10-CM | POA: Diagnosis not present

## 2017-04-06 DIAGNOSIS — N3941 Urge incontinence: Secondary | ICD-10-CM | POA: Diagnosis not present

## 2017-04-06 LAB — CBC WITH DIFFERENTIAL/PLATELET
BASOS ABS: 0 {cells}/uL (ref 0–200)
BASOS PCT: 0 %
EOS ABS: 0 {cells}/uL — AB (ref 15–500)
Eosinophils Relative: 0 %
HEMATOCRIT: 44.9 % (ref 38.5–50.0)
Hemoglobin: 15.5 g/dL (ref 13.0–17.0)
LYMPHS PCT: 18 %
Lymphs Abs: 1494 cells/uL (ref 850–3900)
MCH: 30.5 pg (ref 27.0–33.0)
MCHC: 34.5 g/dL (ref 32.0–36.0)
MCV: 88.4 fL (ref 80.0–100.0)
MONO ABS: 581 {cells}/uL (ref 200–950)
MONOS PCT: 7 %
MPV: 8.7 fL (ref 7.5–12.5)
NEUTROS PCT: 75 %
Neutro Abs: 6225 cells/uL (ref 1500–7800)
PLATELETS: 223 10*3/uL (ref 140–400)
RBC: 5.08 MIL/uL (ref 4.20–5.80)
RDW: 13.3 % (ref 11.0–15.0)
WBC: 8.3 10*3/uL (ref 3.8–10.8)

## 2017-04-06 LAB — TSH: TSH: 1.73 m[IU]/L (ref 0.40–4.50)

## 2017-04-06 NOTE — Progress Notes (Signed)
   Subjective:    Patient ID: Timothy Dean, male    DOB: 11-Jan-1971, 46 y.o.   MRN: [id number]  HPI Patient has had several instances where he reports zigzag shining lights in the periphery of his vision and flashing lights in the periphery of his vision. This will usually occur for 20-30 minutes and then resolve spontaneously. Occasionally, this will be followed by headache which is unilateral pulsatile and associated with photophobia. He denies any history of migraines. He denies any history of head injury. He is also concerned because he become more tired recently. He would like his blood sugar checked along with his other lab work. He is also requesting a PSA because he is been having increasing frequency, increasing nocturia, and occasional urge incontinence. No past medical history on file. Past Surgical History:  Procedure Laterality Date  . NECK SURGERY     Current Outpatient Prescriptions on File Prior to Visit  Medication Sig Dispense Refill  . nebivolol (BYSTOLIC) 10 MG tablet Take 1 tablet (10 mg total) by mouth daily. 90 tablet 3   No current facility-administered medications on file prior to visit.    No Known Allergies Social History   Social History  . Marital status: Divorced    Spouse name: N/A  . Number of children: N/A  . Years of education: N/A   Occupational History  . Not on file.   Social History Main Topics  . Smoking status: Never Smoker  . Smokeless tobacco: Never Used  . Alcohol use No  . Drug use: No  . Sexual activity: Not Currently   Other Topics Concern  . Not on file   Social History Narrative  . No narrative on file     Review of Systems  All other systems reviewed and are negative.      Objective:   Physical Exam  Constitutional: He is oriented to person, place, and time. He appears well-developed and well-nourished. No distress.  HENT:  Head: Normocephalic and atraumatic.  Right Ear: External ear normal.  Left Ear: External  ear normal.  Nose: Nose normal.  Mouth/Throat: Oropharynx is clear and moist. No oropharyngeal exudate.  Eyes: Conjunctivae and EOM are normal. Pupils are equal, round, and reactive to light. Right eye exhibits no discharge. Left eye exhibits no discharge.  Cardiovascular: Normal rate, regular rhythm and normal heart sounds.   Pulmonary/Chest: Effort normal and breath sounds normal.  Abdominal: Soft. Bowel sounds are normal. He exhibits no distension. There is no tenderness. There is no rebound.  Neurological: He is alert and oriented to person, place, and time. He has normal reflexes. He displays normal reflexes. No cranial nerve deficit. He exhibits normal muscle tone. Coordination normal.  Skin: He is not diaphoretic.  Vitals reviewed.         Assessment & Plan:  Chronic fatigue - Plan: CBC with Differential/Platelet, COMPLETE METABOLIC PANEL WITH GFR, TSH  Urgency incontinence - Plan: PSA  Ocular migraine - Plan: Ambulatory referral to Ophthalmology  I believe the patient is experiencing ocular migraines/classic migraines with aura. They occur rarely and therefore I do not believe require medication at the present time. They usually occur once a month according to the patient. However I will schedule the patient see an ophthalmologist for formal funduscopic exam to rule out retinal injury. I will check a PSA per the patient's request along with a CBC, CMP, TSH however his exam today is completely reassuring.

## 2017-04-07 LAB — COMPLETE METABOLIC PANEL WITH GFR
ALT: 14 U/L (ref 9–46)
AST: 19 U/L (ref 10–40)
Albumin: 4.4 g/dL (ref 3.6–5.1)
Alkaline Phosphatase: 55 U/L (ref 40–115)
BUN: 10 mg/dL (ref 7–25)
CALCIUM: 9.2 mg/dL (ref 8.6–10.3)
CHLORIDE: 107 mmol/L (ref 98–110)
CO2: 23 mmol/L (ref 20–31)
CREATININE: 0.87 mg/dL (ref 0.60–1.35)
GFR, Est African American: >89 mL/min (ref >=60)
Glucose, Bld: 104 mg/dL — ABNORMAL HIGH (ref 70–99)
Potassium: 4.3 mmol/L (ref 3.5–5.3)
Sodium: 142 mmol/L (ref 135–146)
Total Bilirubin: 0.7 mg/dL (ref 0.2–1.2)
Total Protein: 7.1 g/dL (ref 6.1–8.1)

## 2017-04-07 LAB — PSA: PSA: 2.2 ng/mL (ref <=4.0)

## 2017-04-17 DIAGNOSIS — R51 Headache: Secondary | ICD-10-CM | POA: Diagnosis not present

## 2017-04-17 DIAGNOSIS — H10413 Chronic giant papillary conjunctivitis, bilateral: Secondary | ICD-10-CM | POA: Diagnosis not present

## 2017-04-17 DIAGNOSIS — H43813 Vitreous degeneration, bilateral: Secondary | ICD-10-CM | POA: Diagnosis not present

## 2017-10-12 ENCOUNTER — Encounter: Payer: Self-pay | Admitting: Family Medicine

## 2017-10-12 ENCOUNTER — Other Ambulatory Visit: Payer: Self-pay

## 2017-10-12 ENCOUNTER — Ambulatory Visit (INDEPENDENT_AMBULATORY_CARE_PROVIDER_SITE_OTHER): Payer: 59 | Admitting: Family Medicine

## 2017-10-12 VITALS — BP 130/74 | HR 82 | Temp 98.2°F | Resp 18 | Ht 70.5 in | Wt 189.0 lb

## 2017-10-12 DIAGNOSIS — R361 Hematospermia: Secondary | ICD-10-CM | POA: Diagnosis not present

## 2017-10-12 LAB — URINALYSIS, ROUTINE W REFLEX MICROSCOPIC
Bilirubin Urine: NEGATIVE
GLUCOSE, UA: NEGATIVE
Hgb urine dipstick: NEGATIVE
Ketones, ur: NEGATIVE
LEUKOCYTES UA: NEGATIVE
Nitrite: NEGATIVE
PH: 5.5 (ref 5.0–8.0)
PROTEIN: NEGATIVE
Specific Gravity, Urine: 1.025 (ref 1.001–1.03)

## 2017-10-12 NOTE — Progress Notes (Signed)
Subjective:    Patient ID: Timothy Dean, male    DOB: 1971/06/23, 46 y.o.   MRN: [id number]  HPI Patient had an isolated episode of hematospermia with masturbation that occurred over the weekend.  He has not previously seen this before.  He denies any hematuria.  He denies any dysuria.  He denies any low back pain.  He denies any pain deep within the rectum near the prostate gland.  He denies any urgency or hesitancy or frequency.  He denies any testicular pain.  He denies any trauma or rash in that area.  He denies any testicular masses.  Testicular exam was performed today and revealed normal symmetric testicles without nodularity or tenderness.  There was no inguinal lymphadenopathy and no palpable hernia.  Prostate exam was performed and the prostate was smooth, symmetric, nontender, without nodularity.  Urinalysis today was completely normal with no evidence of hematuria or infection. History reviewed. No pertinent past medical history. Past Surgical History:  Procedure Laterality Date  . NECK SURGERY     Current Outpatient Medications on File Prior to Visit  Medication Sig Dispense Refill  . nebivolol (BYSTOLIC) 10 MG tablet Take 1 tablet (10 mg total) by mouth daily. 90 tablet 3   No current facility-administered medications on file prior to visit.    No Known Allergies Social History   Socioeconomic History  . Marital status: Divorced    Spouse name: Not on file  . Number of children: Not on file  . Years of education: Not on file  . Highest education level: Not on file  Social Needs  . Financial resource strain: Not on file  . Food insecurity - worry: Not on file  . Food insecurity - inability: Not on file  . Transportation needs - medical: Not on file  . Transportation needs - non-medical: Not on file  Occupational History  . Not on file  Tobacco Use  . Smoking status: Never Smoker  . Smokeless tobacco: Never Used  Substance and Sexual Activity  . Alcohol use: No     Alcohol/week: 0.0 oz  . Drug use: No  . Sexual activity: Not Currently  Other Topics Concern  . Not on file  Social History Narrative  . Not on file      Review of Systems  All other systems reviewed and are negative.      Objective:   Physical Exam  Cardiovascular: Normal rate, regular rhythm and normal heart sounds.  Pulmonary/Chest: Effort normal and breath sounds normal. No respiratory distress. He has no wheezes. He has no rales.  Abdominal: Soft. Bowel sounds are normal. He exhibits no distension. There is no tenderness. There is no rebound and no guarding. Hernia confirmed negative in the right inguinal area and confirmed negative in the left inguinal area.  Genitourinary: Testes normal. Prostate is not enlarged and not tender. Right testis shows no mass and no tenderness. Left testis shows no mass and no tenderness. Circumcised.  Lymphadenopathy:       Right: No inguinal adenopathy present.       Left: No inguinal adenopathy present.  Vitals reviewed.         Assessment & Plan:  Hematospermia - Plan: PSA, Urinalysis, Routine w reflex microscopic  I explained to the patient that this is most likely a benign process.  Prostate exam is normal, testicular exam is normal, penis appears normal on exam, urinalysis is normal.  I will check a PSA.  If lab work is  normal, I recommended clinical monitoring.  If hematospermia persists, consider urology consultation for cystoscopy.  Anticipate this will not occur again

## 2017-10-13 LAB — PSA: PSA: 2.1 ng/mL (ref <=4.0)

## 2017-11-08 ENCOUNTER — Other Ambulatory Visit: Payer: Self-pay | Admitting: Family Medicine

## 2017-11-08 DIAGNOSIS — I10 Essential (primary) hypertension: Secondary | ICD-10-CM

## 2018-05-03 ENCOUNTER — Encounter: Payer: Self-pay | Admitting: Family Medicine

## 2018-05-03 ENCOUNTER — Ambulatory Visit: Payer: 59 | Admitting: Family Medicine

## 2018-05-03 VITALS — BP 156/88 | HR 78 | Temp 99.0°F | Resp 14 | Ht 70.5 in | Wt 193.0 lb

## 2018-05-03 DIAGNOSIS — H60392 Other infective otitis externa, left ear: Secondary | ICD-10-CM

## 2018-05-03 MED ORDER — NEOMYCIN-POLYMYXIN-HC 3.5-10000-1 OT SOLN: 4.0000 [drp] | Freq: Four times a day (QID) | OTIC | 0 refills | Status: DC

## 2018-05-03 NOTE — Progress Notes (Signed)
Subjective:    Patient ID: Timothy Dean, male    DOB: 1971-07-14, 47 y.o.   MRN: [id number]  HPI Patient reports a 24-hour history of left ear pain, decreased hearing in left ear, and copious drainage coming from the left ear.  He reports pain pulling on the tragus and manipulating the pinna. No past medical history on file. Past Surgical History:  Procedure Laterality Date  . NECK SURGERY     Current Outpatient Medications on File Prior to Visit  Medication Sig Dispense Refill  . BYSTOLIC 10 MG tablet TAKE 1 TABLET EVERY DAY 90 tablet 3   No current facility-administered medications on file prior to visit.    No Known Allergies Social History   Socioeconomic History  . Marital status: Divorced    Spouse name: Not on file  . Number of children: Not on file  . Years of education: Not on file  . Highest education level: Not on file  Occupational History  . Not on file  Social Needs  . Financial resource strain: Not on file  . Food insecurity:    Worry: Not on file    Inability: Not on file  . Transportation needs:    Medical: Not on file    Non-medical: Not on file  Tobacco Use  . Smoking status: Never Smoker  . Smokeless tobacco: Never Used  Substance and Sexual Activity  . Alcohol use: No    Alcohol/week: 0.0 oz  . Drug use: No  . Sexual activity: Not Currently  Lifestyle  . Physical activity:    Days per week: Not on file    Minutes per session: Not on file  . Stress: Not on file  Relationships  . Social connections:    Talks on phone: Not on file    Gets together: Not on file    Attends religious service: Not on file    Active member of club or organization: Not on file    Attends meetings of clubs or organizations: Not on file    Relationship status: Not on file  . Intimate partner violence:    Fear of current or ex partner: Not on file    Emotionally abused: Not on file    Physically abused: Not on file    Forced sexual activity: Not on file    Other Topics Concern  . Not on file  Social History Narrative  . Not on file     Review of Systems  All other systems reviewed and are negative.      Objective:   Physical Exam  Constitutional: He is oriented to person, place, and time. He appears well-developed and well-nourished. No distress.  HENT:  Head: Normocephalic and atraumatic.  Right Ear: Tympanic membrane, external ear and ear canal normal.  Left Ear: There is drainage. Tympanic membrane is injected and erythematous.  Nose: Nose normal.  Mouth/Throat: Oropharynx is clear and moist. No oropharyngeal exudate.  Eyes: Pupils are equal, round, and reactive to light. Conjunctivae and EOM are normal. Right eye exhibits no discharge. Left eye exhibits no discharge.  Cardiovascular: Normal rate, regular rhythm and normal heart sounds.  Pulmonary/Chest: Effort normal and breath sounds normal.  Abdominal: Soft. Bowel sounds are normal. He exhibits no distension. There is no tenderness. There is no rebound.  Neurological: He is alert and oriented to person, place, and time. He has normal reflexes. No cranial nerve deficit. He exhibits normal muscle tone. Coordination normal.  Skin: He is not diaphoretic.  Vitals reviewed.         Assessment & Plan:  Other infective acute otitis externa of left ear  Left auditory canal is filled with white exudate/pus.  I am unable to visualize the tympanic membrane however the superior portion appears dull and erythematous.  Begin Cortisporin HC otic drops 4 drops every 6 hours x7 days.  Recheck in 48 hours or sooner if worse.  If not improving, I will extend coverage by starting the patient on oral medication such as Augmentin 875 mg p.o. twice daily for 10 days if not improving.  Encourage the patient to check blood pressure daily as his blood pressure is elevated and notify me of the values in 1 week

## 2018-05-04 ENCOUNTER — Telehealth: Payer: Self-pay | Admitting: Family Medicine

## 2018-05-04 NOTE — Telephone Encounter (Signed)
Pt called and states that now both of his ears are draining and he is using the drops in both ears, no other symptoms. He states he just wanted to let you know.

## 2018-05-05 NOTE — Telephone Encounter (Signed)
Pt called back today and states that his ears are still draining and not getting any better and would like to know what he should do as you had mentioned putting him on an antibx.????

## 2018-05-06 ENCOUNTER — Other Ambulatory Visit: Payer: Self-pay | Admitting: Family Medicine

## 2018-05-06 MED ORDER — AMOXICILLIN-POT CLAVULANATE 875-125 MG PO TABS: 1.0000 | ORAL_TABLET | Freq: Two times a day (BID) | ORAL | 0 refills | Status: DC

## 2018-05-06 NOTE — Telephone Encounter (Signed)
Pt aware.

## 2018-05-06 NOTE — Telephone Encounter (Signed)
I will send in ABx

## 2018-05-13 ENCOUNTER — Telehealth: Payer: Self-pay | Admitting: Family Medicine

## 2018-05-13 NOTE — Telephone Encounter (Signed)
Pt aware and apt made 

## 2018-05-13 NOTE — Telephone Encounter (Signed)
COme back in

## 2018-05-13 NOTE — Telephone Encounter (Signed)
Pt called and states that his ear are some better but still draining and still can not hear out of them and he only has 4 days of antibx left and was wondering is this normal or does he need to come back in or see ENT?

## 2018-05-14 ENCOUNTER — Encounter: Payer: Self-pay | Admitting: Family Medicine

## 2018-05-14 ENCOUNTER — Ambulatory Visit: Payer: 59 | Admitting: Family Medicine

## 2018-05-14 VITALS — BP 140/80 | HR 70 | Temp 98.3°F | Resp 16 | Ht 70.5 in | Wt 189.0 lb

## 2018-05-14 DIAGNOSIS — H9193 Unspecified hearing loss, bilateral: Secondary | ICD-10-CM

## 2018-05-14 DIAGNOSIS — H60392 Other infective otitis externa, left ear: Secondary | ICD-10-CM

## 2018-05-14 MED ORDER — CIPROFLOXACIN-DEXAMETHASONE 0.3-0.1 % OT SUSP: 4.0000 [drp] | Freq: Two times a day (BID) | OTIC | 0 refills | Status: DC

## 2018-05-14 NOTE — Progress Notes (Signed)
Subjective:    Patient ID: Timothy Dean, male    DOB: 12/21/1970, 47 y.o.   MRN: [id number]  HPI  05/03/18 Patient reports a 24-hour history of left ear pain, decreased hearing in left ear, and copious drainage coming from the left ear.  He reports pain pulling on the tragus and manipulating the pinna.  At that time, my plan was: Left auditory canal is filled with white exudate/pus.  I am unable to visualize the tympanic membrane however the superior portion appears dull and erythematous.  Begin Cortisporin HC otic drops 4 drops every 6 hours x7 days.  Recheck in 48 hours or sooner if worse.  If not improving, I will extend coverage by starting the patient on oral medication such as Augmentin 875 mg p.o. twice daily for 10 days if not improving.  Encourage the patient to check blood pressure daily as his blood pressure is elevated and notify me of the values in 1 week  05/14/18 Patient called [place] back later that week stating that the pain was worsening and that there was drainage now coming from both ears.  In addition to the Cortisporin HC otic, I extended his coverage with Augmentin 875 mg p.o. twice daily for 10 days.  Patient is here today to recheck.  He is completed the eardrops.  He still has a few days of the antibiotic pills.  The drainage is completely subsided from the right ear.  There is no residual pain in the right ear.  The pain has completely resolved in the left ear.  There is still drainage coming from the left ear but it is improved.  However he reports hearing loss in both ears.  Auditory exam today reveals low-frequency hearing loss in the right ear.  The patient is unable to appreciate any tones in the left ear at 500, 1000, 2000, or 4000 Hz all the way to 40 dB.  Examination of the right tympanic membrane shows no exudate in the right auditory canal however there is a white debris covering the surface of the tympanic membrane.  Examination of the left tympanic membrane shows  white exudate covering the surface of the tympanic membrane.  There is injection of the blood vessels on the surface of the tympanic membrane.  There is no perforation.  There is still yellow exudate covering the base of the auditory canal.  However exam appears much improved Past Medical History:  Diagnosis Date  . Hypertension    Past Surgical History:  Procedure Laterality Date  . NECK SURGERY     Current Outpatient Medications on File Prior to Visit  Medication Sig Dispense Refill  . amoxicillin-clavulanate (AUGMENTIN) 875-125 MG tablet Take 1 tablet by mouth 2 (two) times daily. 20 tablet 0  . BYSTOLIC 10 MG tablet TAKE 1 TABLET EVERY DAY 90 tablet 3  . neomycin-polymyxin-hydrocortisone (CORTISPORIN) OTIC solution Place 4 drops into the left ear 4 (four) times daily. (Patient not taking: Reported on 05/14/2018) 10 mL 0   No current facility-administered medications on file prior to visit.    No Known Allergies Social History   Socioeconomic History  . Marital status: Divorced    Spouse name: Not on file  . Number of children: Not on file  . Years of education: Not on file  . Highest education level: Not on file  Occupational History  . Not on file  Social Needs  . Financial resource strain: Not on file  . Food insecurity:    Worry:  Not on file    Inability: Not on file  . Transportation needs:    Medical: Not on file    Non-medical: Not on file  Tobacco Use  . Smoking status: Never Smoker  . Smokeless tobacco: Never Used  Substance and Sexual Activity  . Alcohol use: No    Alcohol/week: 0.0 oz  . Drug use: No  . Sexual activity: Not Currently  Lifestyle  . Physical activity:    Days per week: Not on file    Minutes per session: Not on file  . Stress: Not on file  Relationships  . Social connections:    Talks on phone: Not on file    Gets together: Not on file    Attends religious service: Not on file    Active member of club or organization: Not on file     Attends meetings of clubs or organizations: Not on file    Relationship status: Not on file  . Intimate partner violence:    Fear of current or ex partner: Not on file    Emotionally abused: Not on file    Physically abused: Not on file    Forced sexual activity: Not on file  Other Topics Concern  . Not on file  Social History Narrative  . Not on file     Review of Systems  All other systems reviewed and are negative.      Objective:   Physical Exam  Constitutional: He is oriented to person, place, and time. He appears well-developed and well-nourished. No distress.  HENT:  Head: Normocephalic and atraumatic.  Right Ear: Tympanic membrane, external ear and ear canal normal.  Left Ear: There is drainage. Tympanic membrane is injected and erythematous.  Nose: Nose normal.  Mouth/Throat: Oropharynx is clear and moist. No oropharyngeal exudate.  Eyes: Pupils are equal, round, and reactive to light. Conjunctivae and EOM are normal. Right eye exhibits no discharge. Left eye exhibits no discharge.  Cardiovascular: Normal rate, regular rhythm and normal heart sounds.  Pulmonary/Chest: Effort normal and breath sounds normal.  Abdominal: Soft. Bowel sounds are normal. He exhibits no distension. There is no tenderness. There is no rebound.  Neurological: He is alert and oriented to person, place, and time. He has normal reflexes. No cranial nerve deficit. He exhibits normal muscle tone. Coordination normal.  Skin: He is not diaphoretic.  Vitals reviewed.         Assessment & Plan:  Otitis externa, hearing loss bilateral.  I am going to extend treatment with Ciprodex HC otic 4 drops each ear twice daily for 7 days and given the hearing loss, I will consult ENT.  If hearing loss is improving over the next week with the eardrops, we can cancel ENT consultation

## 2018-05-26 DIAGNOSIS — H6993 Unspecified Eustachian tube disorder, bilateral: Secondary | ICD-10-CM | POA: Diagnosis not present

## 2018-05-26 DIAGNOSIS — H903 Sensorineural hearing loss, bilateral: Secondary | ICD-10-CM | POA: Diagnosis not present

## 2018-05-26 DIAGNOSIS — H9042 Sensorineural hearing loss, unilateral, left ear, with unrestricted hearing on the contralateral side: Secondary | ICD-10-CM | POA: Diagnosis not present

## 2018-11-09 ENCOUNTER — Other Ambulatory Visit: Payer: Self-pay | Admitting: Family Medicine

## 2018-11-09 DIAGNOSIS — I10 Essential (primary) hypertension: Secondary | ICD-10-CM

## 2019-05-02 ENCOUNTER — Encounter: Payer: Self-pay | Admitting: Family Medicine

## 2019-05-02 ENCOUNTER — Ambulatory Visit: Payer: 59 | Admitting: Family Medicine

## 2019-05-02 ENCOUNTER — Other Ambulatory Visit: Payer: Self-pay

## 2019-05-02 VITALS — BP 148/88 | HR 68 | Temp 98.8°F | Resp 16 | Ht 70.5 in | Wt 186.0 lb

## 2019-05-02 DIAGNOSIS — I10 Essential (primary) hypertension: Secondary | ICD-10-CM | POA: Diagnosis not present

## 2019-05-02 MED ORDER — HYDROCHLOROTHIAZIDE 25 MG PO TABS: 25.0000 mg | ORAL_TABLET | Freq: Every day | ORAL | 3 refills | Status: DC

## 2019-05-02 NOTE — Progress Notes (Signed)
Subjective:    Patient ID: Timothy Dean, male    DOB: [birth year], 48 y.o.   MRN: [id number]  HPI Patient is currently taking Bystolic 10 mg a day.  Timothy Dean is compliant with the medication and takes it regularly.  Blood pressure has recently been elevated over the last month or so.  His blood pressures consistently in the 140s to 150s over 80s to 93.  Timothy Dean denies any chest pain, shortness of breath, dyspnea on exertion, orthopnea.  Timothy Dean denies any palpitations.  Timothy Dean denies any syncope or near syncope.  Timothy Dean does occasionally have some mild pitting edema in his ankles Past Medical History:  Diagnosis Date  . Hypertension    Past Surgical History:  Procedure Laterality Date  . NECK SURGERY     Current Outpatient Medications on File Prior to Visit  Medication Sig Dispense Refill  . BYSTOLIC 10 MG tablet TAKE 1 TABLET BY MOUTH EVERY DAY 90 tablet 3   No current facility-administered medications on file prior to visit.    No Known Allergies Social History   Socioeconomic History  . Marital status: Divorced    Spouse name: Not on file  . Number of children: Not on file  . Years of education: Not on file  . Highest education level: Not on file  Occupational History  . Not on file  Social Needs  . Financial resource strain: Not on file  . Food insecurity:    Worry: Not on file    Inability: Not on file  . Transportation needs:    Medical: Not on file    Non-medical: Not on file  Tobacco Use  . Smoking status: Never Smoker  . Smokeless tobacco: Never Used  Substance and Sexual Activity  . Alcohol use: No    Alcohol/week: 0.0 standard drinks  . Drug use: No  . Sexual activity: Not Currently  Lifestyle  . Physical activity:    Days per week: Not on file    Minutes per session: Not on file  . Stress: Not on file  Relationships  . Social connections:    Talks on phone: Not on file    Gets together: Not on file    Attends religious service: Not on file    Active member of club or  organization: Not on file    Attends meetings of clubs or organizations: Not on file    Relationship status: Not on file  . Intimate partner violence:    Fear of current or ex partner: Not on file    Emotionally abused: Not on file    Physically abused: Not on file    Forced sexual activity: Not on file  Other Topics Concern  . Not on file  Social History Narrative  . Not on file     Review of Systems  All other systems reviewed and are negative.      Objective:   Physical Exam Vitals signs reviewed.  Constitutional:      General: Timothy Dean is not in acute distress.    Appearance: Normal appearance. Timothy Dean is normal weight. Timothy Dean is not ill-appearing or toxic-appearing.  Cardiovascular:     Rate and Rhythm: Normal rate and regular rhythm.     Pulses: Normal pulses.     Heart sounds: Normal heart sounds. No murmur. No friction rub. No gallop.   Pulmonary:     Effort: Pulmonary effort is normal. No respiratory distress.     Breath sounds: Normal breath sounds. No stridor. No  wheezing, rhonchi or rales.  Chest:     Chest wall: No tenderness.  Abdominal:     General: Bowel sounds are normal.     Palpations: Abdomen is soft.  Musculoskeletal:     Right lower leg: No edema.     Left lower leg: No edema.  Neurological:     Mental Status: Timothy Dean is alert.           Assessment & Plan:  Benign essential HTN - Plan: hydrochlorothiazide (HYDRODIURIL) 25 MG tablet, COMPLETE METABOLIC PANEL WITH GFR, CBC with Differential/Platelet, Lipid panel  Physical exam today is completely normal.  We will continue Bystolic 10 mg a day and supplement with hydrochlorothiazide 25 mg a day.  Recheck blood pressure in 2 weeks via telephone.  Meanwhile check CBC, CMP, fasting lipid panel.  Ideally I like his LDL cholesterol to be less than 100 if possible.

## 2019-05-03 ENCOUNTER — Other Ambulatory Visit: Payer: Self-pay

## 2019-05-03 DIAGNOSIS — R739 Hyperglycemia, unspecified: Secondary | ICD-10-CM

## 2019-05-06 LAB — CBC WITH DIFFERENTIAL/PLATELET
Absolute Monocytes: 511 cells/uL (ref 200–950)
Basophils Absolute: 30 cells/uL (ref 0–200)
Basophils Relative: 0.4 %
Eosinophils Absolute: 59 cells/uL (ref 15–500)
Eosinophils Relative: 0.8 %
HCT: 42.8 % (ref 38.5–50.0)
Hemoglobin: 14.7 g/dL (ref 13.2–17.1)
Lymphs Abs: 1613 cells/uL (ref 850–3900)
MCH: 30.6 pg (ref 27.0–33.0)
MCHC: 34.3 g/dL (ref 32.0–36.0)
MCV: 89 fL (ref 80.0–100.0)
MPV: 9.1 fL (ref 7.5–12.5)
Monocytes Relative: 6.9 %
Neutro Abs: 5187 cells/uL (ref 1500–7800)
Neutrophils Relative %: 70.1 %
Platelets: 251 10*3/uL (ref 140–400)
RBC: 4.81 10*6/uL (ref 4.20–5.80)
RDW: 12.7 % (ref 11.0–15.0)
Total Lymphocyte: 21.8 %
WBC: 7.4 10*3/uL (ref 3.8–10.8)

## 2019-05-06 LAB — LIPID PANEL
Cholesterol: 154 mg/dL (ref <200)
HDL: 34 mg/dL — ABNORMAL LOW (ref >=40)
LDL Cholesterol (Calc): 100 mg/dL (calc) — ABNORMAL HIGH
Non-HDL Cholesterol (Calc): 120 mg/dL (calc) (ref <130)
Total CHOL/HDL Ratio: 4.5 (calc) (ref <5.0)
Triglycerides: 106 mg/dL (ref <150)

## 2019-05-06 LAB — COMPLETE METABOLIC PANEL WITH GFR
AG Ratio: 1.7 (calc) (ref 1.0–2.5)
ALT: 14 U/L (ref 9–46)
AST: 22 U/L (ref 10–40)
Albumin: 4.3 g/dL (ref 3.6–5.1)
Alkaline phosphatase (APISO): 59 U/L (ref 36–130)
BUN: 14 mg/dL (ref 7–25)
CO2: 26 mmol/L (ref 20–32)
Calcium: 9.2 mg/dL (ref 8.6–10.3)
Chloride: 103 mmol/L (ref 98–110)
Creat: 0.96 mg/dL (ref 0.60–1.35)
GFR, Est African American: 108 mL/min/{1.73_m2} (ref >=60)
GFR, Est Non African American: 93 mL/min/{1.73_m2} (ref >=60)
Globulin: 2.5 g/dL (calc) (ref 1.9–3.7)
Glucose, Bld: 152 mg/dL — ABNORMAL HIGH (ref 65–99)
Potassium: 4.5 mmol/L (ref 3.5–5.3)
Sodium: 137 mmol/L (ref 135–146)
Total Bilirubin: 0.6 mg/dL (ref 0.2–1.2)
Total Protein: 6.8 g/dL (ref 6.1–8.1)

## 2019-05-06 LAB — HEMOGLOBIN A1C
Hgb A1c MFr Bld: 5.2 % of total Hgb (ref <5.7)
Mean Plasma Glucose: 103 (calc)
eAG (mmol/L): 5.7 (calc)

## 2019-11-05 ENCOUNTER — Other Ambulatory Visit: Payer: Self-pay | Admitting: Family Medicine

## 2019-11-05 DIAGNOSIS — I10 Essential (primary) hypertension: Secondary | ICD-10-CM

## 2020-05-01 ENCOUNTER — Other Ambulatory Visit: Payer: Self-pay | Admitting: Family Medicine

## 2020-05-01 DIAGNOSIS — I10 Essential (primary) hypertension: Secondary | ICD-10-CM

## 2020-05-02 ENCOUNTER — Ambulatory Visit
Admission: RE | Admit: 2020-05-02 | Discharge: 2020-05-02 | Disposition: A | Discharge status: Home / Self Care | Payer: 59 | Source: Ambulatory Visit | Attending: Nurse Practitioner | Admitting: Nurse Practitioner

## 2020-05-02 ENCOUNTER — Other Ambulatory Visit: Payer: Self-pay

## 2020-05-02 ENCOUNTER — Ambulatory Visit: Payer: 59 | Admitting: Nurse Practitioner

## 2020-05-02 VITALS — BP 132/82 | HR 85 | Temp 97.6°F | Resp 18 | Wt 196.2 lb

## 2020-05-02 DIAGNOSIS — M25561 Pain in right knee: Secondary | ICD-10-CM

## 2020-05-02 DIAGNOSIS — M25461 Effusion, right knee: Secondary | ICD-10-CM

## 2020-05-02 MED ORDER — PREDNISONE 10 MG PO TABS: 10.0000 mg | ORAL_TABLET | Freq: Every day | ORAL | 0 refills | Status: DC

## 2020-05-02 MED ORDER — METHYLPREDNISOLONE ACETATE 80 MG/ML IJ SUSP: 80.0000 mg | Freq: Once | INTRAMUSCULAR | Status: DC

## 2020-05-02 MED ORDER — PENTAFLUOROPROP-TETRAFLUOROETH EX AERO: 1.0000 "application " | INHALATION_SPRAY | Freq: Once | CUTANEOUS | Status: DC

## 2020-05-02 MED ORDER — KETOROLAC TROMETHAMINE 60 MG/2ML IM SOLN
60.0000 mg | Freq: Once | INTRAMUSCULAR | Status: AC
  Administered 2020-05-02: 60 mg via INTRAMUSCULAR

## 2020-05-02 MED ORDER — LIDOCAINE HCL (PF) 1 % IJ SOLN: 0.5000 mL | Freq: Once | INTRAMUSCULAR | Status: DC

## 2020-05-02 MED ORDER — PREDNISONE 10 MG PO TABS: ORAL_TABLET | ORAL | 0 refills | Status: DC

## 2020-05-02 NOTE — Progress Notes (Signed)
Established Patient Office Visit  Subjective:  Patient ID: Timothy Dean, male    DOB: [birth year]  Age: 48 y.o. MRN: [id number]  CC:  Chief Complaint  Patient presents with  . Edema    R knee, started x2 weeks  . Foreign Body in Skin    on side of ride leg on 06/08    HPI Timothy Dean is a 49 year old male presenting with two weeks of sxs of right knee swollen. 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 He has not had this sxs before. He has tried no treatments.   Treatment plan discussed with pt of Toradol in clinic, attempt to aspirate to send to lab for pathology, steroid injection, post care treatment and directions verbally and printed. Pt desires to proceed.   Past Medical History:  Diagnosis Date  . Hypertension     Past Surgical History:  Procedure Laterality Date  . NECK SURGERY      Family History  Problem Relation Age of Onset  . Diabetes Father   . Hyperlipidemia Father     Social History   Socioeconomic History  . Marital status: Divorced    Spouse name: Not on file  . Number of children: Not on file  . Years of education: Not on file  . Highest education level: Not on file  Occupational History  . Not on file  Tobacco Use  . Smoking status: Never Smoker  . Smokeless tobacco: Never Used  Substance and Sexual Activity  . Alcohol use: No    Alcohol/week: 0.0 standard drinks  . Drug use: No  . Sexual activity: Not Currently  Other Topics Concern  . Not on file  Social History Narrative  . Not on file   Social Determinants of Health   Financial Resource Strain:   . Difficulty of Paying Living Expenses:   Food Insecurity:   . Worried About Charity fundraiser in the Last Year:   . Arboriculturist in the Last Year:   Transportation Needs:   . Film/video editor (Medical):   Marland Kitchen Lack of Transportation (Non-Medical):   Physical Activity:   . Days of Exercise per Week:   . Minutes of Exercise per Session:   Stress:     . Feeling of Stress :   Social Connections:   . Frequency of Communication with Friends and Family:   . Frequency of Social Gatherings with Friends and Family:   . Attends Religious Services:   . Active Member of Clubs or Organizations:   . Attends Archivist Meetings:   Marland Kitchen Marital Status:   Intimate Partner Violence:   . Fear of Current or Ex-Partner:   . Emotionally Abused:   Marland Kitchen Physically Abused:   . Sexually Abused:     Outpatient Medications Prior to Visit  Medication Sig Dispense Refill  . BYSTOLIC 10 MG tablet TAKE 1 TABLET BY MOUTH EVERY DAY 90 tablet 3  . hydrochlorothiazide (HYDRODIURIL) 25 MG tablet TAKE 1 TABLET BY MOUTH EVERY DAY 30 tablet 0   No facility-administered medications prior to visit.    No Known Allergies  ROS Review of Systems  All other systems reviewed and are negative.     Objective:    Physical Exam  Constitutional: He is oriented to person, place, and time. He appears well-developed and well-nourished.  HENT:  Head: Normocephalic.  Eyes: Pupils are equal, round, and reactive to light. Conjunctivae and EOM  are normal.  Neck: No JVD present.  Cardiovascular: Normal rate and regular rhythm.  Pulmonary/Chest: Effort normal.  Abdominal: Soft.  Musculoskeletal:        General: Tenderness present. No deformity or edema. Normal range of motion.     Cervical back: Normal range of motion and neck supple.     Right knee: Effusion present.  Neurological: He is alert and oriented to person, place, and time.  Skin: Skin is warm and dry.  Psychiatric: He has a normal mood and affect. His behavior is normal. Judgment and thought content normal.    BP 132/82 (BP Location: Left Arm, Patient Position: Sitting, Cuff Size: Normal)   Pulse 85   Temp 97.6 F (36.4 C) (Temporal)   Resp 18   Wt 196 lb 3.2 oz (89 kg)   SpO2 96%   BMI 27.75 kg/m  Wt Readings from Last 3 Encounters:  05/02/20 196 lb 3.2 oz (89 kg)  05/02/19 186 lb (84.4 kg)   05/14/18 189 lb (85.7 kg)     Health Maintenance Due  Topic Date Due  . Hepatitis C Screening  Never done  . HIV Screening  Never done    There are no preventive care reminders to display for this patient.  Lab Results  Component Value Date   TSH 1.73 04/06/2017   Lab Results  Component Value Date   WBC 7.4 05/02/2019   HGB 14.7 05/02/2019   HCT 42.8 05/02/2019   MCV 89.0 05/02/2019   PLT 251 05/02/2019   Lab Results  Component Value Date   NA 137 05/02/2019   K 4.5 05/02/2019   CO2 26 05/02/2019   GLUCOSE 152 (H) 05/02/2019   BUN 14 05/02/2019   CREATININE 0.96 05/02/2019   BILITOT 0.6 05/02/2019   ALKPHOS 55 04/06/2017   AST 22 05/02/2019   ALT 14 05/02/2019   PROT 6.8 05/02/2019   ALBUMIN 4.4 04/06/2017   CALCIUM 9.2 05/02/2019   Lab Results  Component Value Date   CHOL 154 05/02/2019   Lab Results  Component Value Date   HDL 34 (L) 05/02/2019   Lab Results  Component Value Date   LDLCALC 100 (H) 05/02/2019   Lab Results  Component Value Date   TRIG 106 05/02/2019   Lab Results  Component Value Date   CHOLHDL 4.5 05/02/2019   Lab Results  Component Value Date   HGBA1C 5.2 05/02/2019      Assessment & Plan:   Problem List Items Addressed This Visit      Other   Pain and swelling of right knee - Primary   Relevant Medications   ketorolac (TORADOL) injection 60 mg   predniSONE (DELTASONE) 10 MG tablet   lidocaine (PF) (XYLOCAINE) 1 % injection 0.5 mL (Start on 05/02/2020  9:30 AM)   pentafluoroprop-tetrafluoroeth (GEBAUERS) aerosol 1 application (Start on 4/0/9811  9:30 AM)   Other Relevant Orders   DG Knee Complete 4 Views Right    Other Visit Diagnoses    Knee effusion, right       Relevant Medications   lidocaine (PF) (XYLOCAINE) 1 % injection 0.5 mL (Start on 05/02/2020  9:30 AM)   pentafluoroprop-tetrafluoroeth (GEBAUERS) aerosol 1 application (Start on 07/25/4781  9:30 AM)    Toradol 60 mg IM administered in clinic: tolerated  well, no reaction  No fluid was aspirated.   May apply ice 20 minutes 2-3 times per day, use a removable brace at bedtime throughout the night, elevate the affected knee  at or above the level of your heart while you are sitting or lying.  May use over the counter medications for relief of symptoms such as Voltaren gel topically, Ibuprofen 800 mg every 8 hours on full stomach with Pepcid 20 mg twice a day to protect your stomach.   Take prednisone as directed  Complete Xray of right knee today  Medical attention for signs or symptoms of infection such as increased redness, drainage, foul odor, increased swelling, fever or chills.  Meds ordered this encounter  Medications  . ketorolac (TORADOL) injection 60 mg  . DISCONTD: predniSONE (DELTASONE) 10 MG tablet    Sig: Take 1 tablet (10 mg total) by mouth daily with breakfast.    Dispense:  21 tablet    Refill:  0  . predniSONE (DELTASONE) 10 MG tablet    Sig: Take 6 tabs on day 1; take 5 tabs day 2; take 4 tabs day 3; take 4 tabs day 4; take 3 tabs day 2; take 2 tabs day 5; take 1 tab day 6. Stop    Dispense:  21 tablet    Refill:  0  . lidocaine (PF) (XYLOCAINE) 1 % injection 0.5 mL  . pentafluoroprop-tetrafluoroeth (GEBAUERS) aerosol 1 application    Follow-up: Return if symptoms worsen or fail to improve.    Annie Main, FNP

## 2020-05-02 NOTE — Patient Instructions (Addendum)
Toradol 60 mg IM administered in clinic: tolerated well, no reaction   May apply ice 20 minutes 2-3 times per day, use a removable brace at bedtime throughout the night, elevate the affected knee at or above the level of your heart while you are sitting or lying.  May use over the counter medications for relief of symptoms such as Voltaren gel topically, Ibuprofen 800 mg every 8 hours on full stomach with Pepcid 20 mg twice a day to protect your stomach.   Take prednisone as directed  Complete Xray of right knee today  Medical attention for signs or symptoms of infection such as increased redness, drainage, foul odor, increased swelling, fever or chills.

## 2020-05-03 NOTE — Progress Notes (Signed)
IMPRESSION: Marked prepatellar soft tissue swelling compatible which may reflect prepatellar bursitis.  Treatment plan as per yesterday.

## 2020-05-18 ENCOUNTER — Other Ambulatory Visit: Payer: Self-pay

## 2020-05-18 ENCOUNTER — Ambulatory Visit: Payer: 59 | Admitting: Family Medicine

## 2020-05-18 VITALS — BP 130/90 | HR 81 | Temp 97.7°F | Ht 70.0 in | Wt 194.0 lb

## 2020-05-18 DIAGNOSIS — M7041 Prepatellar bursitis, right knee: Secondary | ICD-10-CM

## 2020-05-18 NOTE — Progress Notes (Signed)
Subjective:    Patient ID: Timothy Dean, male    DOB: 07-Feb-1971, 49 y.o.   MRN: [id number]  HPI Patient presents today with swelling in his right knee.  The swelling has been present for more than a month.  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  He has a collection of fluid anterior to the patella.  It is large.  There is no erythema or warmth.  It is minimally sore.  He would like to have the fluid removed. Past Medical History:  Diagnosis Date  . Hypertension    Past Surgical History:  Procedure Laterality Date  . NECK SURGERY     Current Outpatient Medications on File Prior to Visit  Medication Sig Dispense Refill  . BYSTOLIC 10 MG tablet TAKE 1 TABLET BY MOUTH EVERY DAY 90 tablet 3  . hydrochlorothiazide (HYDRODIURIL) 25 MG tablet TAKE 1 TABLET BY MOUTH EVERY DAY 30 tablet 0   Current Facility-Administered Medications on File Prior to Visit  Medication Dose Route Frequency Provider Last Rate Last Admin  . lidocaine (PF) (XYLOCAINE) 1 % injection 0.5 mL  0.5 mL Intradermal Once Redmond Baseman, Crystal A, FNP      . methylPREDNISolone acetate (DEPO-MEDROL) injection 80 mg  80 mg Intra-articular Once Annie Main, FNP      . pentafluoroprop-tetrafluoroeth (GEBAUERS) aerosol 1 application  1 application Topical Once Redmond Baseman, Crystal A, FNP       No Known Allergies Social History   Socioeconomic History  . Marital status: Divorced    Spouse name: Not on file  . Number of children: Not on file  . Years of education: Not on file  . Highest education level: Not on file  Occupational History  . Not on file  Tobacco Use  . Smoking status: Never Smoker  . Smokeless tobacco: Never Used  Substance and Sexual Activity  . Alcohol use: No    Alcohol/week: 0.0 standard drinks  . Drug use: No  . Sexual activity: Not Currently  Other Topics Concern  . Not on file  Social History Narrative  . Not on file   Social Determinants of Health   Financial  Resource Strain:   . Difficulty of Paying Living Expenses:   Food Insecurity:   . Worried About Charity fundraiser in the Last Year:   . Arboriculturist in the Last Year:   Transportation Needs:   . Film/video editor (Medical):   Marland Kitchen Lack of Transportation (Non-Medical):   Physical Activity:   . Days of Exercise per Week:   . Minutes of Exercise per Session:   Stress:   . Feeling of Stress :   Social Connections:   . Frequency of Communication with Friends and Family:   . Frequency of Social Gatherings with Friends and Family:   . Attends Religious Services:   . Active Member of Clubs or Organizations:   . Attends Archivist Meetings:   Marland Kitchen Marital Status:   Intimate Partner Violence:   . Fear of Current or Ex-Partner:   . Emotionally Abused:   Marland Kitchen Physically Abused:   . Sexually Abused:      Review of Systems  All other systems reviewed and are negative.      Objective:   Physical Exam Vitals reviewed.  Constitutional:      General: He is not in acute distress.    Appearance: Normal appearance. He is normal weight. He is not ill-appearing  or toxic-appearing.  Cardiovascular:     Rate and Rhythm: Normal rate and regular rhythm.     Pulses: Normal pulses.     Heart sounds: Normal heart sounds. No murmur heard.  No friction rub. No gallop.   Pulmonary:     Effort: Pulmonary effort is normal. No respiratory distress.     Breath sounds: Normal breath sounds. No stridor. No wheezing, rhonchi or rales.  Chest:     Chest wall: No tenderness.  Abdominal:     General: Bowel sounds are normal.     Palpations: Abdomen is soft.  Musculoskeletal:     Right knee: Swelling present. Decreased range of motion. Tenderness present.     Right lower leg: No edema.     Left lower leg: No edema.  Neurological:     Mental Status: He is alert.           Assessment & Plan:  Prepatellar bursitis of right knee  Patient has large prepatellar bursitis in his right  knee.  After discussing his options he elects to have this aspirated.  The knee was cleaned thoroughly with Betadine.  The skin was anesthetized with 0.1% lidocaine.  An 18-gauge needle was then inserted through the inferior portion of the bursa and the contents of the bursa sac were aspirated.  I was able to withdrawal 50 cc of bloody serous fluid.  I then introduced 1 cc of 40 mg/mL Kenalog through the exact same needle without removing the needle from the bursa.  I then applied a pressure dressing with wound care directions and also infection warning signs.  Continue to use a pressure dressing for the next 3 days to allow the cortisone to work effectively.

## 2020-05-27 ENCOUNTER — Other Ambulatory Visit: Payer: Self-pay | Admitting: Family Medicine

## 2020-05-27 DIAGNOSIS — I10 Essential (primary) hypertension: Secondary | ICD-10-CM

## 2020-06-01 ENCOUNTER — Ambulatory Visit: Payer: 59 | Admitting: Family Medicine

## 2020-06-01 ENCOUNTER — Encounter: Payer: Self-pay | Admitting: Gastroenterology

## 2020-06-01 ENCOUNTER — Other Ambulatory Visit: Payer: Self-pay

## 2020-06-01 VITALS — BP 138/82 | HR 72 | Temp 98.2°F | Ht 70.0 in | Wt 191.0 lb

## 2020-06-01 DIAGNOSIS — Z1211 Encounter for screening for malignant neoplasm of colon: Secondary | ICD-10-CM | POA: Diagnosis not present

## 2020-06-01 DIAGNOSIS — Z0000 Encounter for general adult medical examination without abnormal findings: Secondary | ICD-10-CM

## 2020-06-01 NOTE — Progress Notes (Signed)
Subjective:    Patient ID: Timothy Dean, male    DOB: December 12, 1970, 49 y.o.   MRN: [id number]  HPI Patient is a very pleasant 49 year old Caucasian male here today for complete physical exam.  He denies any concerns other than he will occasionally feel some tingling in his lips.  It is located on the outside of his lips or on his upper lip.  It occurs rarely.  There is no lancing or shooting pain in his face.  There is no numbness in his face.  He denies any rash on his face or paralysis in the face.  Symptoms sound neuropathic in nature most likely related to the trigeminal nerve or the glossopharyngeal nerve.  At the present time we have decided not to pursue an MRI as the symptoms are extremely mild and sporadic.  He is due for a colonoscopy.  He would like me to schedule this.  He is also due for prostate cancer screening.  He declines HIV and hepatitis C screening due to lack of risk factors.  He has not had his Covid vaccination.  We discussed this today and I strongly encouraged it. Past Medical History:  Diagnosis Date  . Hypertension    Past Surgical History:  Procedure Laterality Date  . NECK SURGERY     Current Outpatient Medications on File Prior to Visit  Medication Sig Dispense Refill  . BYSTOLIC 10 MG tablet TAKE 1 TABLET BY MOUTH EVERY DAY 90 tablet 3  . hydrochlorothiazide (HYDRODIURIL) 25 MG tablet TAKE 1 TABLET BY MOUTH EVERY DAY 30 tablet 0   Current Facility-Administered Medications on File Prior to Visit  Medication Dose Route Frequency Provider Last Rate Last Admin  . lidocaine (PF) (XYLOCAINE) 1 % injection 0.5 mL  0.5 mL Intradermal Once Redmond Baseman, Crystal A, FNP      . methylPREDNISolone acetate (DEPO-MEDROL) injection 80 mg  80 mg Intra-articular Once Annie Main, FNP      . pentafluoroprop-tetrafluoroeth (GEBAUERS) aerosol 1 application  1 application Topical Once Redmond Baseman, Crystal A, FNP       No Known Allergies Social History   Socioeconomic History  .  Marital status: Divorced    Spouse name: Not on file  . Number of children: Not on file  . Years of education: Not on file  . Highest education level: Not on file  Occupational History  . Not on file  Tobacco Use  . Smoking status: Never Smoker  . Smokeless tobacco: Never Used  Substance and Sexual Activity  . Alcohol use: No    Alcohol/week: 0.0 standard drinks  . Drug use: No  . Sexual activity: Not Currently  Other Topics Concern  . Not on file  Social History Narrative  . Not on file   Social Determinants of Health   Financial Resource Strain:   . Difficulty of Paying Living Expenses:   Food Insecurity:   . Worried About Charity fundraiser in the Last Year:   . Arboriculturist in the Last Year:   Transportation Needs:   . Film/video editor (Medical):   Marland Kitchen Lack of Transportation (Non-Medical):   Physical Activity:   . Days of Exercise per Week:   . Minutes of Exercise per Session:   Stress:   . Feeling of Stress :   Social Connections:   . Frequency of Communication with Friends and Family:   . Frequency of Social Gatherings with Friends and Family:   . Attends Religious Services:   .  Active Member of Clubs or Organizations:   . Attends Archivist Meetings:   Marland Kitchen Marital Status:   Intimate Partner Violence:   . Fear of Current or Ex-Partner:   . Emotionally Abused:   Marland Kitchen Physically Abused:   . Sexually Abused:      Review of Systems  All other systems reviewed and are negative.      Objective:   Physical Exam Vitals reviewed.  Constitutional:      General: He is not in acute distress.    Appearance: Normal appearance. He is normal weight. He is not ill-appearing or toxic-appearing.  HENT:     Head: Normocephalic and atraumatic.     Right Ear: Tympanic membrane, ear canal and external ear normal.     Left Ear: Tympanic membrane, ear canal and external ear normal.     Nose: Nose normal. No congestion or rhinorrhea.     Mouth/Throat:      Mouth: Mucous membranes are moist.     Pharynx: Oropharynx is clear. No oropharyngeal exudate or posterior oropharyngeal erythema.  Eyes:     General: No scleral icterus.       Right eye: No discharge.        Left eye: No discharge.     Extraocular Movements: Extraocular movements intact.     Conjunctiva/sclera: Conjunctivae normal.     Pupils: Pupils are equal, round, and reactive to light.  Neck:     Vascular: No carotid bruit.  Cardiovascular:     Rate and Rhythm: Normal rate and regular rhythm.     Pulses: Normal pulses.     Heart sounds: Normal heart sounds. No murmur heard.  No friction rub. No gallop.   Pulmonary:     Effort: Pulmonary effort is normal. No respiratory distress.     Breath sounds: Normal breath sounds. No stridor. No wheezing, rhonchi or rales.  Chest:     Chest wall: No tenderness.  Abdominal:     General: Bowel sounds are normal. There is no distension.     Palpations: Abdomen is soft. There is no mass.     Tenderness: There is no abdominal tenderness. There is no guarding or rebound.     Hernia: No hernia is present.  Musculoskeletal:        General: No swelling, tenderness, deformity or signs of injury.     Cervical back: Neck supple. No rigidity or tenderness.     Right lower leg: No edema.     Left lower leg: No edema.  Lymphadenopathy:     Cervical: No cervical adenopathy.  Skin:    Coloration: Skin is not jaundiced or pale.     Findings: No bruising, erythema, lesion or rash.  Neurological:     General: No focal deficit present.     Mental Status: He is alert and oriented to person, place, and time. Mental status is at baseline.     Cranial Nerves: No cranial nerve deficit.     Sensory: No sensory deficit.     Motor: No weakness.     Coordination: Coordination normal.     Gait: Gait normal.     Deep Tendon Reflexes: Reflexes normal.  Psychiatric:        Mood and Affect: Mood normal.        Behavior: Behavior normal.        Thought  Content: Thought content normal.        Judgment: Judgment normal.  Assessment & Plan:  Colon cancer screening - Plan: Ambulatory referral to Gastroenterology  General medical exam - Plan: CBC with Differential/Platelet, COMPLETE METABOLIC PANEL WITH GFR, Lipid panel, PSA  Physical exam today is completely normal.  Patient's blood pressure is controlled.  I will schedule the patient for a colonoscopy.  I will screen for prostate cancer with a PSA.  I will check a CBC, CMP, fasting lipid panel.  I recommended the Covid vaccination.  Patient politely declines hepatitis C and HIV screening.  I believe the occasional perioral numbness could be related to trigeminal neuralgia.  At the present time we have decided not to pursue an MRI as the symptoms are sporadic however if they worsen or become more profound I would recommend an MRI of the brain to evaluate further

## 2020-06-04 ENCOUNTER — Other Ambulatory Visit: Payer: Self-pay

## 2020-06-04 DIAGNOSIS — E785 Hyperlipidemia, unspecified: Secondary | ICD-10-CM | POA: Insufficient documentation

## 2020-06-04 MED ORDER — ATORVASTATIN CALCIUM 20 MG PO TABS: 20.0000 mg | ORAL_TABLET | Freq: Every day | ORAL | 1 refills | Status: DC

## 2020-06-06 LAB — COMPLETE METABOLIC PANEL WITH GFR
AG Ratio: 1.8 (calc) (ref 1.0–2.5)
ALT: 25 U/L (ref 9–46)
AST: 19 U/L (ref 10–35)
Albumin: 4.7 g/dL (ref 3.6–5.1)
Alkaline phosphatase (APISO): 52 U/L (ref 35–144)
BUN: 19 mg/dL (ref 7–25)
CO2: 27 mmol/L (ref 20–32)
Calcium: 9.7 mg/dL (ref 8.6–10.3)
Chloride: 100 mmol/L (ref 98–110)
Creat: 0.93 mg/dL (ref 0.70–1.33)
GFR, Est African American: 111 mL/min/{1.73_m2} (ref >=60)
GFR, Est Non African American: 95 mL/min/{1.73_m2} (ref >=60)
Globulin: 2.6 g/dL (calc) (ref 1.9–3.7)
Glucose, Bld: 112 mg/dL — ABNORMAL HIGH (ref 65–99)
Potassium: 4 mmol/L (ref 3.5–5.3)
Sodium: 137 mmol/L (ref 135–146)
Total Bilirubin: 1 mg/dL (ref 0.2–1.2)
Total Protein: 7.3 g/dL (ref 6.1–8.1)

## 2020-06-06 LAB — LIPID PANEL
Cholesterol: 253 mg/dL — ABNORMAL HIGH (ref <200)
HDL: 51 mg/dL (ref >=40)
LDL Cholesterol (Calc): 178 mg/dL (calc) — ABNORMAL HIGH
Non-HDL Cholesterol (Calc): 202 mg/dL (calc) — ABNORMAL HIGH (ref <130)
Total CHOL/HDL Ratio: 5 (calc) — ABNORMAL HIGH (ref <5.0)
Triglycerides: 114 mg/dL (ref <150)

## 2020-06-06 LAB — CBC WITH DIFFERENTIAL/PLATELET
Absolute Monocytes: 657 cells/uL (ref 200–950)
Basophils Absolute: 36 cells/uL (ref 0–200)
Basophils Relative: 0.4 %
Eosinophils Absolute: 36 cells/uL (ref 15–500)
Eosinophils Relative: 0.4 %
HCT: 50 % (ref 38.5–50.0)
Hemoglobin: 17.1 g/dL (ref 13.2–17.1)
Lymphs Abs: 1791 cells/uL (ref 850–3900)
MCH: 30.3 pg (ref 27.0–33.0)
MCHC: 34.2 g/dL (ref 32.0–36.0)
MCV: 88.7 fL (ref 80.0–100.0)
MPV: 8.7 fL (ref 7.5–12.5)
Monocytes Relative: 7.3 %
Neutro Abs: 6480 cells/uL (ref 1500–7800)
Neutrophils Relative %: 72 %
Platelets: 296 10*3/uL (ref 140–400)
RBC: 5.64 10*6/uL (ref 4.20–5.80)
RDW: 13.1 % (ref 11.0–15.0)
Total Lymphocyte: 19.9 %
WBC: 9 10*3/uL (ref 3.8–10.8)

## 2020-06-06 LAB — TEST AUTHORIZATION

## 2020-06-06 LAB — HEMOGLOBIN A1C W/OUT EAG: Hgb A1c MFr Bld: 5.8 % of total Hgb — ABNORMAL HIGH (ref <5.7)

## 2020-06-06 LAB — PSA: PSA: 2.4 ng/mL (ref <=4.0)

## 2020-06-07 ENCOUNTER — Other Ambulatory Visit: Payer: Self-pay

## 2020-06-07 MED ORDER — ROSUVASTATIN CALCIUM 20 MG PO TABS: 20.0000 mg | ORAL_TABLET | Freq: Every day | ORAL | 5 refills | Status: DC

## 2020-07-01 ENCOUNTER — Other Ambulatory Visit: Payer: Self-pay | Admitting: Family Medicine

## 2020-07-01 DIAGNOSIS — I10 Essential (primary) hypertension: Secondary | ICD-10-CM

## 2020-07-18 ENCOUNTER — Ambulatory Visit (AMBULATORY_SURGERY_CENTER): Payer: Self-pay | Admitting: *Deleted

## 2020-07-18 ENCOUNTER — Encounter: Payer: Self-pay | Admitting: Gastroenterology

## 2020-07-18 ENCOUNTER — Other Ambulatory Visit: Payer: Self-pay

## 2020-07-18 VITALS — Ht 70.0 in | Wt 190.0 lb

## 2020-07-18 DIAGNOSIS — Z01818 Encounter for other preprocedural examination: Secondary | ICD-10-CM

## 2020-07-18 DIAGNOSIS — Z1211 Encounter for screening for malignant neoplasm of colon: Secondary | ICD-10-CM

## 2020-07-18 MED ORDER — PLENVU 140 G PO SOLR: 1.0000 | ORAL | 0 refills | Status: DC

## 2020-07-18 NOTE — Progress Notes (Signed)
cov test 9-3  No egg or soy allergy known to patient  No issues with past sedation with any surgeries or procedures no intubation problems in the past  No FH of Malignant Hyperthermia No diet pills per patient No home 02 use per patient  No blood thinners per patient  Pt denies issues with constipation  No A fib or A flutter  EMMI video to pt or via [place] 19 guidelines implemented in [facility] today with Pt and RN   [facility]  Coupon given to pt in [facility] today , Code to Pharmacy   Due to the COVID-19 pandemic we are asking patients to follow these guidelines. Please only bring one care partner. Please be aware that your care partner may wait in the car in the parking lot or if they feel like they will be too hot to wait in the car, they may wait in the lobby on the 4th floor. All care partners are required to wear a mask the entire time (we do not have any that we can provide them), they need to practice social distancing, and we will do a Covid check for all patient's and care partners when you arrive. Also we will check their temperature and your temperature. If the care partner waits in their car they need to stay in the parking lot the entire time and we will call them on their cell phone when the patient is ready for discharge so they can bring the car to the front of the building. Also all patient's will need to wear a mask into building.

## 2020-07-27 ENCOUNTER — Other Ambulatory Visit: Payer: Self-pay | Admitting: Gastroenterology

## 2020-07-27 ENCOUNTER — Ambulatory Visit (INDEPENDENT_AMBULATORY_CARE_PROVIDER_SITE_OTHER): Payer: 59

## 2020-07-27 DIAGNOSIS — Z1159 Encounter for screening for other viral diseases: Secondary | ICD-10-CM

## 2020-07-27 LAB — SARS CORONAVIRUS 2 (TAT 6-24 HRS): SARS Coronavirus 2: NEGATIVE

## 2020-07-31 ENCOUNTER — Encounter: Payer: Self-pay | Admitting: Certified Registered Nurse Anesthetist

## 2020-08-01 ENCOUNTER — Encounter: Payer: Self-pay | Admitting: Gastroenterology

## 2020-08-01 ENCOUNTER — Ambulatory Visit (AMBULATORY_SURGERY_CENTER): Payer: 59 | Admitting: Gastroenterology

## 2020-08-01 ENCOUNTER — Other Ambulatory Visit: Payer: Self-pay

## 2020-08-01 VITALS — BP 122/74 | HR 58 | Temp 96.9°F | Resp 16 | Ht 70.0 in | Wt 190.0 lb

## 2020-08-01 DIAGNOSIS — D123 Benign neoplasm of transverse colon: Secondary | ICD-10-CM | POA: Diagnosis not present

## 2020-08-01 DIAGNOSIS — Z1211 Encounter for screening for malignant neoplasm of colon: Secondary | ICD-10-CM

## 2020-08-01 MED ORDER — SODIUM CHLORIDE 0.9 % IV SOLN: 500.0000 mL | Freq: Once | INTRAVENOUS | Status: DC

## 2020-08-01 NOTE — Progress Notes (Signed)
[facility]  Pt's states no medical or surgical changes since previsit or office visit.  

## 2020-08-01 NOTE — Op Note (Signed)
[facility] Patient Name: Timothy Dean Procedure Date: 08/01/2020 8:50 AM MRN: [id number] Endoscopist: Mallie Mussel L. Loletha Dean , MD Age: 49 Referring MD:  Date of Birth: 03-25-1971 Gender: Male Account #: [id number] Procedure:                Colonoscopy Indications:              Screening for colorectal malignant neoplasm, This                            is the patient's first colonoscopy Medicines:                Monitored Anesthesia Care Procedure:                Pre-Anesthesia Assessment:                           - Prior to the procedure, a History and Physical                            was performed, and patient medications and                            allergies were reviewed. The patient's tolerance of                            previous anesthesia was also reviewed. The risks                            and benefits of the procedure and the sedation                            options and risks were discussed with the patient.                            All questions were answered, and informed consent                            was obtained. Prior Anticoagulants: The patient has                            taken no previous anticoagulant or antiplatelet                            agents. ASA Grade Assessment: II - A patient with                            mild systemic disease. After reviewing the risks                            and benefits, the patient was deemed in                            satisfactory condition to undergo the procedure.  After obtaining informed consent, the colonoscope                            was passed under direct vision. Throughout the                            procedure, the patient's blood pressure, pulse, and                            oxygen saturations were monitored continuously. The                            Colonoscope was introduced through the anus and                            advanced to the the cecum,  identified by                            appendiceal orifice and ileocecal valve. The                            colonoscopy was performed without difficulty. The                            patient tolerated the procedure well. The quality                            of the bowel preparation was excellent. The                            ileocecal valve, appendiceal orifice, and rectum                            were photographed. The bowel preparation used was                            Plenvu. Scope In: 8:57:50 AM Scope Out: 9:13:45 AM Scope Withdrawal Time: 0 hours 13 minutes 6 seconds  Total Procedure Duration: 0 hours 15 minutes 55 seconds  Findings:                 External hemorrhoids and a skin tag were found on                            perianal exam.                           A few diverticula were found in the proximal                            descending colon and distal transverse colon.                           A 5 mm polyp was found in the mid transverse colon.  The polyp was sessile. The polyp was removed with a                            cold snare. Resection and retrieval were complete.                           The exam was otherwise without abnormality on                            direct and retroflexion views. Complications:            No immediate complications. Estimated Blood Loss:     Estimated blood loss was minimal. Impression:               - Hemorrhoids found on perianal exam.                           - Diverticulosis in the proximal descending colon                            and in the distal transverse colon.                           - One 5 mm polyp in the mid transverse colon,                            removed with a cold snare. Resected and retrieved.                           - The examination was otherwise normal on direct                            and retroflexion views. Recommendation:           - Patient has a  contact number available for                            emergencies. The signs and symptoms of potential                            delayed complications were discussed with the                            patient. Return to normal activities tomorrow.                            Written discharge instructions were provided to the                            patient.                           - Resume previous diet.                           - Continue present medications.                           -  Await pathology results.                           - Repeat colonoscopy is recommended for                            surveillance. The colonoscopy date will be                            determined after pathology results from today's                            exam become available for review. Timothy Cuthbertson L. Loletha Carrow, MD 08/01/2020 9:18:59 AM This report has been signed electronically.

## 2020-08-01 NOTE — Progress Notes (Signed)
Report given to PACU, vss 

## 2020-08-01 NOTE — Progress Notes (Signed)
Called to room to assist during endoscopic procedure.  Patient ID and intended procedure confirmed with present staff. Received instructions for my participation in the procedure from the performing physician.  

## 2020-08-01 NOTE — Patient Instructions (Signed)
HANDOUTS PROVIDED ON: POLYPS & DIVERTICULOSIS  The polyp removed today have been sent for pathology.  The results can take 1-3 weeks to receive.  When your next colonoscopy should occur will be based on the pathology results.    You may resume your previous diet and medication schedule.  Thank you for allowing us to care for you today!!!   YOU HAD AN ENDOSCOPIC PROCEDURE TODAY AT THE [facility]:   Refer to the procedure report that was given to you for any specific questions about what was found during the examination.  If the procedure report does not answer your questions, please call your gastroenterologist to clarify.  If you requested that your care partner not be given the details of your procedure findings, then the procedure report has been included in a sealed envelope for you to review at your convenience later.  YOU SHOULD EXPECT: Some feelings of bloating in the abdomen. Passage of more gas than usual.  Walking can help get rid of the air that was put into your GI tract during the procedure and reduce the bloating. If you had a lower endoscopy (such as a colonoscopy or flexible sigmoidoscopy) you may notice spotting of blood in your stool or on the toilet paper. If you underwent a bowel prep for your procedure, you may not have a normal bowel movement for a few days.  Please Note:  You might notice some irritation and congestion in your nose or some drainage.  This is from the oxygen used during your procedure.  There is no need for concern and it should clear up in a day or so.  SYMPTOMS TO REPORT IMMEDIATELY:   Following lower endoscopy (colonoscopy or flexible sigmoidoscopy):  Excessive amounts of blood in the stool  Significant tenderness or worsening of abdominal pains  Swelling of the abdomen that is new, acute  Fever of 100F or higher  For urgent or emergent issues, a gastroenterologist can be reached at any hour by calling [phone]. Do not use MyChart  messaging for urgent concerns.    DIET:  We do recommend a small meal at first, but then you may proceed to your regular diet.  Drink plenty of fluids but you should avoid alcoholic beverages for 24 hours.  ACTIVITY:  You should plan to take it easy for the rest of today and you should NOT DRIVE or use heavy machinery until tomorrow (because of the sedation medicines used during the test).    FOLLOW UP: Our staff will call the number listed on your records 48-72 hours following your procedure to check on you and address any questions or concerns that you may have regarding the information given to you following your procedure. If we do not reach you, we will leave a message.  We will attempt to reach you two times.  During this call, we will ask if you have developed any symptoms of COVID 19. If you develop any symptoms (ie: fever, flu-like symptoms, shortness of breath, cough etc.) before then, please call [phone].  If you test positive for Covid 19 in the 2 weeks post procedure, please call and report this information to us.    If any biopsies were taken you will be contacted by phone or by letter within the next 1-3 weeks.  Please call us at [phone] if you have not heard about the biopsies in 3 weeks.    SIGNATURES/CONFIDENTIALITY: You and/or your care partner have signed paperwork which will be   entered into your electronic medical record.  These signatures attest to the fact that that the information above on your After Visit Summary has been reviewed and is understood.  Full responsibility of the confidentiality of this discharge information lies with you and/or your care-partner. 

## 2020-08-03 ENCOUNTER — Telehealth: Payer: Self-pay

## 2020-08-03 ENCOUNTER — Telehealth: Payer: Self-pay | Admitting: *Deleted

## 2020-08-03 NOTE — Telephone Encounter (Signed)
  Follow up Call-  Call back number 08/01/2020  Post procedure Call Back phone  # [phone]  Permission to leave phone message Yes  Some recent data might be hidden     Patient questions:  Message left to call us if necessary.

## 2020-08-03 NOTE — Telephone Encounter (Signed)
°  Follow up Call-  Call back number 08/01/2020  Post procedure Call Back phone  # [phone]  Permission to leave phone message Yes  Some recent data might be hidden     Patient questions:  Do you have a fever, pain , or abdominal swelling? No. Pain Score  0 *  Have you tolerated food without any problems? Yes.    Have you been able to return to your normal activities? Yes.    Do you have any questions about your discharge instructions: Diet   No. Medications  No. Follow up visit  No.  Do you have questions or concerns about your Care? No.  Actions: * If pain score is 4 or above: No action needed, pain <4. 1. Have you developed a fever since your procedure? no  2.   Have you had an respiratory symptoms (SOB or cough) since your procedure? no  3.   Have you tested positive for COVID 19 since your procedure no  4.   Have you had any family members/close contacts diagnosed with the COVID 19 since your procedure?  no   If yes to any of these questions please route to Joylene John, RN and Joella Prince, RN

## 2020-08-09 ENCOUNTER — Encounter: Payer: Self-pay | Admitting: Gastroenterology

## 2020-08-20 ENCOUNTER — Other Ambulatory Visit: Payer: Self-pay

## 2020-08-20 ENCOUNTER — Ambulatory Visit: Payer: 59 | Admitting: Family Medicine

## 2020-08-20 VITALS — BP 124/80 | HR 68 | Temp 97.9°F | Ht 70.0 in | Wt 193.0 lb

## 2020-08-20 DIAGNOSIS — M7041 Prepatellar bursitis, right knee: Secondary | ICD-10-CM | POA: Diagnosis not present

## 2020-08-20 NOTE — Progress Notes (Signed)
Subjective:    Patient ID: Timothy Dean, male    DOB: 1971/04/11, 49 y.o.   MRN: [id number]  HPI   Patient is a very pleasant 49 year old Caucasian gentleman who presents today with swelling in his right knee.  I saw the patient for the same issue approximately 3 months ago.  At that time he had prepatellar bursitis and we aspirated the prepatellar bursa without complication.  He states that the swelling stayed down for the last 3 months without any difficulty.  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  He states that a week ago the fluid reaccumulated in the prepatellar bursa anterior to the knee.  Today on exam, he has a large fluid collection directly above the patella.  There is no erythema.  There is no warmth.  There is no tenderness or pain.  However the fluid collection causes the patient discomfort at work and he would like it removed. Past Medical History:  Diagnosis Date  . Allergy    occ and very mild   . Hyperlipidemia   . Hypertension    Past Surgical History:  Procedure Laterality Date  . COLONOSCOPY     many years ago- normal per pt- in his 20's  . NECK SURGERY     Current Outpatient Medications on File Prior to Visit  Medication Sig Dispense Refill  . BYSTOLIC 10 MG tablet TAKE 1 TABLET BY MOUTH EVERY DAY 90 tablet 3  . hydrochlorothiazide (HYDRODIURIL) 25 MG tablet TAKE 1 TABLET BY MOUTH EVERY DAY 30 tablet 2  . rosuvastatin (CRESTOR) 20 MG tablet Take 1 tablet (20 mg total) by mouth daily. 30 tablet 5   No current facility-administered medications on file prior to visit.   No Known Allergies Social History   Socioeconomic History  . Marital status: Divorced    Spouse name: Not on file  . Number of children: Not on file  . Years of education: Not on file  . Highest education level: Not on file  Occupational History  . Not on file  Tobacco Use  . Smoking  status: Never Smoker  . Smokeless tobacco: Never Used  Vaping Use  . Vaping Use: Never used  Substance and Sexual Activity  . Alcohol use: No    Alcohol/week: 0.0 standard drinks  . Drug use: No  . Sexual activity: Not Currently  Other Topics Concern  . Not on file  Social History Narrative  . Not on file   Social Determinants of Health   Financial Resource Strain:   . Difficulty of Paying Living Expenses: Not on file  Food Insecurity:   . Worried About Charity fundraiser in the Last Year: Not on file  . Ran Out of Food in the Last Year: Not on file  Transportation Needs:   . Lack of Transportation (Medical): Not on file  . Lack of Transportation (Non-Medical): Not on file  Physical Activity:   . Days of Exercise per Week: Not on file  . Minutes of Exercise per Session: Not on file  Stress:   . Feeling of Stress : Not on file  Social Connections:   . Frequency of Communication with Friends and Family: Not on file  . Frequency of Social Gatherings with Friends and Family: Not on file  . Attends Religious Services: Not on file  . Active Member of Clubs or Organizations: Not on file  .  Attends Archivist Meetings: Not on file  . Marital Status: Not on file  Intimate Partner Violence:   . Fear of Current or Ex-Partner: Not on file  . Emotionally Abused: Not on file  . Physically Abused: Not on file  . Sexually Abused: Not on file     Review of Systems  All other systems reviewed and are negative.      Objective:   Physical Exam Vitals reviewed.  Constitutional:      General: He is not in acute distress.    Appearance: Normal appearance. He is normal weight. He is not ill-appearing or toxic-appearing.  Cardiovascular:     Rate and Rhythm: Normal rate and regular rhythm.     Pulses: Normal pulses.     Heart sounds: Normal heart sounds. No murmur heard.  No friction rub. No gallop.   Pulmonary:     Effort: Pulmonary effort is normal. No respiratory  distress.     Breath sounds: Normal breath sounds. No stridor. No wheezing, rhonchi or rales.  Chest:     Chest wall: No tenderness.  Abdominal:     General: Bowel sounds are normal.     Palpations: Abdomen is soft.  Musculoskeletal:     Right knee: Swelling present. Decreased range of motion. Tenderness present.     Right lower leg: No edema.     Left lower leg: No edema.  Neurological:     Mental Status: He is alert.           Assessment & Plan:  Prepatellar bursitis of right knee  Patient has large prepatellar bursitis in his right knee.  After discussing his options he elects to have this aspirated.  The knee was cleaned thoroughly with Betadine.  The skin was anesthetized with 0.1% lidocaine.  An 18-gauge needle was then inserted through the inferior portion of the bursa and the contents of the bursa sac were aspirated.  I was able to withdrawal 30 cc of dark bloody fluid.  I then introduced 1 cc of 40 mg/mL Kenalog through the exact same needle without removing the needle from the bursa.  I then applied a pressure dressing with wound care directions and also infection warning signs.  Continue to use a pressure dressing for the next 3 days to allow the cortisone to work effectively.  I also recommended the patient wear kneepads to help prevent this in the future.  If this continues to happen, I would recommend seeing an orthopedic surgeon to discuss definitive treatment options as I would not recommend repeated injections into the bursa due to the risk of infection.  Patient is comfortable with this plan.  He tolerated the procedure well.

## 2020-09-29 ENCOUNTER — Other Ambulatory Visit: Payer: Self-pay | Admitting: Family Medicine

## 2020-09-29 DIAGNOSIS — I10 Essential (primary) hypertension: Secondary | ICD-10-CM

## 2020-11-06 ENCOUNTER — Other Ambulatory Visit: Payer: Self-pay | Admitting: Family Medicine

## 2020-11-06 DIAGNOSIS — I10 Essential (primary) hypertension: Secondary | ICD-10-CM

## 2020-12-14 ENCOUNTER — Other Ambulatory Visit: Payer: Self-pay | Admitting: Family Medicine

## 2021-03-29 ENCOUNTER — Other Ambulatory Visit: Payer: Self-pay | Admitting: *Deleted

## 2021-03-29 DIAGNOSIS — I10 Essential (primary) hypertension: Secondary | ICD-10-CM

## 2021-03-29 MED ORDER — HYDROCHLOROTHIAZIDE 25 MG PO TABS: 1.0000 | ORAL_TABLET | Freq: Every day | ORAL | 0 refills | Status: DC

## 2021-04-12 ENCOUNTER — Telehealth: Payer: Self-pay | Admitting: *Deleted

## 2021-04-12 DIAGNOSIS — I10 Essential (primary) hypertension: Secondary | ICD-10-CM

## 2021-04-12 NOTE — Telephone Encounter (Signed)
Received request from pharmacy for PA on Bystolic.  PA submitted.   Dx: I10- essential HTN  [facility] is reviewing your PA request. Typically an electronic response will be received within 24-72 hours. To check for an update later, open this request from your dashboard.  You may close this dialog and return to your dashboard to perform other tasks.

## 2021-04-16 MED ORDER — NEBIVOLOL HCL 10 MG PO TABS: 10.0000 mg | ORAL_TABLET | Freq: Every day | ORAL | 3 refills | Status: DC

## 2021-04-16 NOTE — Telephone Encounter (Signed)
Received PA determination.   PA- [id number] approved through 04/12/2022.  Pharmacy made aware.

## 2021-04-16 NOTE — Addendum Note (Signed)
Addended by: Sheral Flow on: 04/16/2021 12:58 PM   Modules accepted: Orders

## 2021-04-22 ENCOUNTER — Other Ambulatory Visit: Payer: Self-pay | Admitting: Family Medicine

## 2021-04-22 DIAGNOSIS — I10 Essential (primary) hypertension: Secondary | ICD-10-CM

## 2021-05-13 ENCOUNTER — Encounter: Payer: Self-pay | Admitting: Family Medicine

## 2021-05-13 ENCOUNTER — Other Ambulatory Visit: Payer: Self-pay

## 2021-05-13 ENCOUNTER — Ambulatory Visit: Payer: 59 | Admitting: Family Medicine

## 2021-05-13 DIAGNOSIS — I10 Essential (primary) hypertension: Secondary | ICD-10-CM | POA: Diagnosis not present

## 2021-05-13 MED ORDER — ROSUVASTATIN CALCIUM 20 MG PO TABS: 20.0000 mg | ORAL_TABLET | Freq: Every day | ORAL | 1 refills | Status: DC

## 2021-05-13 MED ORDER — HYDROCHLOROTHIAZIDE 25 MG PO TABS
1.0000 | ORAL_TABLET | Freq: Every day | ORAL | 1 refills | Status: DC
Start: 2021-05-13 — End: 2021-11-05

## 2021-05-13 MED ORDER — NEBIVOLOL HCL 10 MG PO TABS
10.0000 mg | ORAL_TABLET | Freq: Every day | ORAL | 3 refills | Status: DC
Start: 2021-05-13 — End: 2022-05-21

## 2021-05-13 NOTE — Progress Notes (Signed)
Subjective:    Patient ID: Timothy Dean, male    DOB: 1971/11/05, 50 y.o.   MRN: [id number]  HPI Patient presents today for a follow-up of his hypertension.  Blood pressure here is 144/78.  However the patient works in [employer].  He is constantly out in the heat.  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  He will sweat constantly.  By the end of the day, he often feels lightheaded or feels weak and lightheaded when he stands up.  Therefore I am hesitant to increase his blood pressure medicine any further.  He denies any chest pain shortness of breath or dyspnea on exertion.  Overall he is been doing very well. Past Medical History:  Diagnosis Date   Allergy    occ and very mild    Hyperlipidemia    Hypertension    Past Surgical History:  Procedure Laterality Date   COLONOSCOPY     many years ago- normal per pt- in his 20's   NECK SURGERY     No current outpatient medications on file prior to visit.   No current facility-administered medications on file prior to visit.   No Known Allergies Social History   Socioeconomic History   Marital status: Divorced    Spouse name: Not on file   Number of children: Not on file   Years of education: Not on file   Highest education level: Not on file  Occupational History   Not on file  Tobacco Use   Smoking status: Never   Smokeless tobacco: Never  Vaping Use   Vaping Use: Never used  Substance and Sexual Activity   Alcohol use: No    Alcohol/week: 0.0 standard drinks   Drug use: No   Sexual activity: Not Currently  Other Topics Concern   Not on file  Social History Narrative   Not on file   Social Determinants of Health   Financial Resource Strain: Not on file  Food Insecurity: Not on file  Transportation Needs: Not on file  Physical Activity: Not on file  Stress: Not on file  Social Connections: Not on file  Intimate Partner Violence: Not on file     Review of Systems  All other  systems reviewed and are negative.     Objective:   Physical Exam Vitals reviewed.  Constitutional:      General: He is not in acute distress.    Appearance: Normal appearance. He is normal weight. He is not ill-appearing or toxic-appearing.  Cardiovascular:     Rate and Rhythm: Normal rate and regular rhythm.     Pulses: Normal pulses.     Heart sounds: Normal heart sounds. No murmur heard.   No friction rub. No gallop.  Pulmonary:     Effort: Pulmonary effort is normal. No respiratory distress.     Breath sounds: Normal breath sounds. No stridor. No wheezing, rhonchi or rales.  Chest:     Chest wall: No tenderness.  Abdominal:     General: Bowel sounds are normal.     Palpations: Abdomen is soft.  Musculoskeletal:     Right lower leg: No edema.     Left lower leg: No edema.  Neurological:     Mental Status: He is alert.          Assessment & Plan:  Benign essential HTN - Plan: hydrochlorothiazide (HYDRODIURIL) 25 MG tablet  Essential hypertension - Plan: rosuvastatin (CRESTOR) 20 MG tablet,  hydrochlorothiazide (HYDRODIURIL) 25 MG tablet, nebivolol (BYSTOLIC) 10 MG tablet, CBC with Differential/Platelet, COMPLETE METABOLIC PANEL WITH GFR  Physical exam today is completely normal.  We will continue Bystolic 10 mg a day and supplement with hydrochlorothiazide 25 mg a day.  Blood pressure slightly elevated today but I would like to give a little bit of leeway to this patient given the fact that some of his history suggest possible orthostatic hypotension secondary to dehydration.  Monitor renal function closely and encourage the patient to drink plenty of fluids during the day.  He is not fasting so I cannot check his cholesterol

## 2021-05-14 ENCOUNTER — Encounter: Payer: Self-pay | Admitting: *Deleted

## 2021-05-14 LAB — COMPLETE METABOLIC PANEL WITH GFR
AG Ratio: 1.7 (calc) (ref 1.0–2.5)
ALT: 24 U/L (ref 9–46)
AST: 25 U/L (ref 10–35)
Albumin: 4.2 g/dL (ref 3.6–5.1)
Alkaline phosphatase (APISO): 54 U/L (ref 35–144)
BUN: 13 mg/dL (ref 7–25)
CO2: 27 mmol/L (ref 20–32)
Calcium: 9 mg/dL (ref 8.6–10.3)
Chloride: 105 mmol/L (ref 98–110)
Creat: 0.93 mg/dL (ref 0.70–1.33)
GFR, Est African American: 111 mL/min/{1.73_m2} (ref >=60)
GFR, Est Non African American: 95 mL/min/{1.73_m2} (ref >=60)
Globulin: 2.5 g/dL (calc) (ref 1.9–3.7)
Glucose, Bld: 92 mg/dL (ref 65–99)
Potassium: 4.1 mmol/L (ref 3.5–5.3)
Sodium: 141 mmol/L (ref 135–146)
Total Bilirubin: 0.5 mg/dL (ref 0.2–1.2)
Total Protein: 6.7 g/dL (ref 6.1–8.1)

## 2021-05-14 LAB — CBC WITH DIFFERENTIAL/PLATELET
Absolute Monocytes: 502 cells/uL (ref 200–950)
Basophils Absolute: 51 cells/uL (ref 0–200)
Basophils Relative: 0.9 %
Eosinophils Absolute: 68 cells/uL (ref 15–500)
Eosinophils Relative: 1.2 %
HCT: 44.1 % (ref 38.5–50.0)
Hemoglobin: 15 g/dL (ref 13.2–17.1)
Lymphs Abs: 1208 cells/uL (ref 850–3900)
MCH: 29.9 pg (ref 27.0–33.0)
MCHC: 34 g/dL (ref 32.0–36.0)
MCV: 87.8 fL (ref 80.0–100.0)
MPV: 9.3 fL (ref 7.5–12.5)
Monocytes Relative: 8.8 %
Neutro Abs: 3870 cells/uL (ref 1500–7800)
Neutrophils Relative %: 67.9 %
Platelets: 283 10*3/uL (ref 140–400)
RBC: 5.02 10*6/uL (ref 4.20–5.80)
RDW: 12.9 % (ref 11.0–15.0)
Total Lymphocyte: 21.2 %
WBC: 5.7 10*3/uL (ref 3.8–10.8)

## 2021-11-05 ENCOUNTER — Other Ambulatory Visit: Payer: Self-pay | Admitting: Family Medicine

## 2021-11-05 DIAGNOSIS — I10 Essential (primary) hypertension: Secondary | ICD-10-CM

## 2022-02-11 ENCOUNTER — Other Ambulatory Visit: Payer: Self-pay | Admitting: Family Medicine

## 2022-02-11 DIAGNOSIS — I10 Essential (primary) hypertension: Secondary | ICD-10-CM

## 2022-03-26 ENCOUNTER — Emergency Department (HOSPITAL_BASED_OUTPATIENT_CLINIC_OR_DEPARTMENT_OTHER)
Admission: EM | Admit: 2022-03-26 | Discharge: 2022-03-26 | Disposition: A | Discharge status: Home / Self Care | Payer: 59 | Attending: Emergency Medicine | Admitting: Emergency Medicine

## 2022-03-26 ENCOUNTER — Encounter (HOSPITAL_BASED_OUTPATIENT_CLINIC_OR_DEPARTMENT_OTHER): Payer: Self-pay

## 2022-03-26 ENCOUNTER — Other Ambulatory Visit: Payer: Self-pay

## 2022-03-26 DIAGNOSIS — S0591XA Unspecified injury of right eye and orbit, initial encounter: Secondary | ICD-10-CM | POA: Diagnosis present

## 2022-03-26 DIAGNOSIS — X58XXXA Exposure to other specified factors, initial encounter: Secondary | ICD-10-CM | POA: Insufficient documentation

## 2022-03-26 DIAGNOSIS — S0501XA Injury of conjunctiva and corneal abrasion without foreign body, right eye, initial encounter: Secondary | ICD-10-CM | POA: Diagnosis not present

## 2022-03-26 DIAGNOSIS — Y990 Civilian activity done for income or pay: Secondary | ICD-10-CM | POA: Insufficient documentation

## 2022-03-26 MED ORDER — OFLOXACIN 0.3 % OP SOLN
2.0000 [drp] | Freq: Once | OPHTHALMIC | Status: AC
  Administered 2022-03-26: 2 [drp] via OPHTHALMIC
  Filled 2022-03-26: qty 5

## 2022-03-26 MED ORDER — FLUORESCEIN SODIUM 1 MG OP STRP
ORAL_STRIP | OPHTHALMIC | Status: AC
  Administered 2022-03-26: 1
  Filled 2022-03-26: qty 1

## 2022-03-26 MED ORDER — TETRACAINE HCL 0.5 % OP SOLN
2.0000 [drp] | Freq: Once | OPHTHALMIC | Status: AC
  Administered 2022-03-26: 2 [drp] via OPHTHALMIC
  Filled 2022-03-26: qty 4

## 2022-03-26 NOTE — ED Notes (Signed)
Works for [employer] and feels something in his rt eye, +watery ?

## 2022-03-26 NOTE — ED Provider Notes (Signed)
?[facility] EMERGENCY DEPT ?Provider Note ? ? ?CSN: [id number] ?Arrival date & time: 03/26/22  1929 ? ?  ? ?History ?Chief Complaint  ?Patient presents with  ? Foreign Body in [place]  ? ? ?Timothy Dean is a 51 y.o. male who presents to the emergency department with a foreign body sensation in the right eye that occurred approximate around noon today.  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  He immediately irrigated with eye solution at the job and felt the foreign body move.  He states the pain is improved from earlier today but still has foreign body sensation.  He denies any vision loss but does report associated mild blurred vision. ? ? ?Foreign Body in Eye ? ? ?  ? ?Home Medications ?Prior to Admission medications   ?Medication Sig Start Date End Date Taking? Authorizing Provider  ?hydrochlorothiazide (HYDRODIURIL) 25 MG tablet TAKE 1 TABLET (25 MG TOTAL) BY MOUTH DAILY. 02/12/22   Susy Frizzle, MD  ?nebivolol (BYSTOLIC) 10 MG tablet Take 1 tablet (10 mg total) by mouth daily. 05/13/21   Susy Frizzle, MD  ?rosuvastatin (CRESTOR) 20 MG tablet TAKE 1 TABLET BY MOUTH EVERY DAY 02/12/22   Susy Frizzle, MD  ?   ? ?Allergies    ?Patient has no known allergies.   ? ?Review of Systems   ?Review of Systems  ?All other systems reviewed and are negative. ? ?Physical Exam ?Updated Vital Signs ?BP (!) 144/84 (BP Location: Right Arm)   Pulse 85   Temp 98.7 ?F (37.1 ?C) (Temporal)   Resp 16   Ht '5\' 10"'$  (1.778 m)   Wt 90.7 kg   SpO2 100%   BMI 28.69 kg/m?  ?Physical Exam ?Vitals and nursing note reviewed.  ?Constitutional:   ?   Appearance: Normal appearance.  ?HENT:  ?   Head: Normocephalic and atraumatic.  ?Eyes:  ?   General:     ?   Right eye: No discharge.     ?   Left eye: No discharge.  ?   Intraocular pressure: Right eye pressure is 19 mmHg.  ?   Extraocular Movements: Extraocular movements intact.  ?   Conjunctiva/sclera:  ?   Right eye:  Right conjunctiva is injected. No chemosis, exudate or hemorrhage. ?Pulmonary:  ?   Effort: Pulmonary effort is normal.  ?Skin: ?   General: Skin is warm and dry.  ?   Findings: No rash.  ?Neurological:  ?   General: No focal deficit present.  ?   Mental Status: He is alert.  ?Psychiatric:     ?   Mood and Affect: Mood normal.     ?   Behavior: Behavior normal.  ? ? ?ED Results / Procedures / Treatments   ?Labs ?(all labs ordered are listed, but only abnormal results are displayed) ?Labs Reviewed - No data to display ? ?EKG ?None ? ?Radiology ?No results found. ? ?Procedures ?Procedures  ? ? ?Medications Ordered in ED ?Medications  ?ofloxacin (OCUFLOX) 0.3 % ophthalmic solution 2 drop (has no administration in time range)  ?tetracaine (PONTOCAINE) 0.5 % ophthalmic solution 2 drop (2 drops Right Eye Given 03/26/22 2204)  ?fluorescein 1 MG ophthalmic strip (1 strip  Given 03/26/22 2205)  ? ? ?ED Course/ Medical Decision Making/ A&P ?Clinical Course as of 03/26/22 2217  ?Wed Mar 26, 2022  ?2208 I everted the upper lid and swept with a cotton applicator tip.  No foreign body visualized.  I then applied tetracaine which improved the patient's pain and then dyed the right eye with fluorescein.  There was some slight uptake at the 6 o'clock position inferior to the pupil.  Negative Seidel sign.  I also swept the inferior lid with a cotton tip applicator did not see any foreign body. [CF]  ?  ?Clinical Course User Index ?[CF] Myna Bright M, PA-C  ? ?                        ?Medical Decision Making ?Risk ?Prescription drug management. ? ? ?Timothy Dean is a 51 y.o. male who presents to the emergency department with a foreign body sensation in the right eye.  I did not personally visualize any evidence of foreign body.  Negative Seidel sign.  Concern that there was uptake in the 6 o'clock position I will treat the patient for corneal abrasion.  I will give him ofloxacin drops here in the emergency department he can take  the rest of the bottle home.  Strict return precautions were discussed.  Pressures of the eye were normal on the right and measured around 19.  I will have him follow-up with his PCP for further evaluation.  He is safe for discharge. ? ? ? ?Final Clinical Impression(s) / ED Diagnoses ?Final diagnoses:  ?Abrasion of right cornea, initial encounter  ? ? ?Rx / DC Orders ?ED Discharge Orders   ? ? None  ? ?  ? ? ?  ?Hendricks Limes, PA-C ?03/26/22 2217 ? ?  ?Varney Biles, MD ?03/29/22 1038 ? ?

## 2022-03-26 NOTE — ED Triage Notes (Signed)
Patient here POV from Home. ? ?Endorses having a FB in Right Eye since 1200 today when he was working (works in [employer]). Attempted several times to flush Eye but only became more irritated and painful. ? ?No Visual Aids.  ? ?NAD Noted during Triage. A&Ox4. GCS 15. Ambulatory.  ?

## 2022-03-26 NOTE — Discharge Instructions (Signed)
Please use antibiotic drops.  You can use 2 drops every 6 hours in the right eye.  Would like for you to follow-up with your PCP for further evaluation if needed or if symptoms worsen please return to the emergency department. ?

## 2022-05-16 ENCOUNTER — Other Ambulatory Visit: Payer: Self-pay | Admitting: Family Medicine

## 2022-05-16 DIAGNOSIS — I10 Essential (primary) hypertension: Secondary | ICD-10-CM

## 2022-05-21 NOTE — Telephone Encounter (Signed)
Patient has an appt scheduled with Dr. Dennard Schaumann on 06/06/2022

## 2022-05-21 NOTE — Telephone Encounter (Signed)
Requested medication (s) are due for refill today: yes  Requested medication (s) are on the active medication list: yes  Last refill:  05/13/21 #90/3  Future visit scheduled: yes  Notes to clinic:  Unable to refill per protocol due to failed labs, no updated results.'     Requested Prescriptions  Pending Prescriptions Disp Refills   nebivolol (BYSTOLIC) 10 MG tablet [Pharmacy Med Name: NEBIVOLOL 10 MG TABLET] 90 tablet 3    Sig: TAKE 1 TABLET BY MOUTH EVERY DAY     Cardiovascular: Beta Blockers 3 Failed - 05/21/2022 11:07 AM      Failed - Cr in normal range and within 360 days    Creat  Date Value Ref Range Status  05/13/2021 0.93 0.70 - 1.33 mg/dL Final    Comment:    For patients >12 years of age, the reference limit for Creatinine is approximately 13% higher for people identified as African-American. .          Failed - AST in normal range and within 360 days    AST  Date Value Ref Range Status  05/13/2021 25 10 - 35 U/L Final         Failed - ALT in normal range and within 360 days    ALT  Date Value Ref Range Status  05/13/2021 24 9 - 46 U/L Final         Failed - Last BP in normal range    BP Readings from Last 1 Encounters:  03/26/22 (!) 144/84         Failed - Valid encounter within last 6 months    Recent Outpatient Visits           1 year ago Benign essential HTN   [place] Dennard Schaumann, Cammie Mcgee, MD   1 year ago Prepatellar bursitis of right knee   [place] Dennard Schaumann, Cammie Mcgee, MD   1 year ago Colon cancer screening   [place] Susy Frizzle, MD   2 years ago Prepatellar bursitis of right knee   [place] Susy Frizzle, MD   2 years ago Pain and swelling of right knee   [place], [place], FNP       Future Appointments             In 2 weeks Dennard Schaumann, Cammie Mcgee, MD [place], [facility]            Passed - Last Heart  Rate in normal range    Pulse Readings from Last 1 Encounters:  03/26/22 85

## 2022-06-06 ENCOUNTER — Ambulatory Visit: Payer: 59 | Admitting: Family Medicine

## 2022-06-06 VITALS — BP 128/68 | HR 91 | Temp 98.2°F | Ht 70.0 in | Wt 196.0 lb

## 2022-06-06 DIAGNOSIS — I10 Essential (primary) hypertension: Secondary | ICD-10-CM | POA: Diagnosis not present

## 2022-06-06 DIAGNOSIS — E785 Hyperlipidemia, unspecified: Secondary | ICD-10-CM

## 2022-06-06 DIAGNOSIS — Z125 Encounter for screening for malignant neoplasm of prostate: Secondary | ICD-10-CM | POA: Diagnosis not present

## 2022-06-06 NOTE — Progress Notes (Signed)
Subjective:    Patient ID: Timothy Dean, male    DOB: [birth year], 51 y.o.   MRN: [id number]  HPI  Patient had colonoscopy with 1 tubular adenoma in 2021.  Repeat is due in 7 years.  Due for PSA.  His blood pressure today is outstanding at 128/68.  However he has noticed more muscle aches in his legs recently.  He states that they ache and throb on a daily basis.  He denies any muscle pain in his shoulders or his arms.  He questions if it could be the cholesterol medication.  Of note he works in [employer].  He is constantly crawling on his knees to do his job.  He is also working in a squatted position frequently and I imagine sweating a lot this time of year.  I suspect the muscle aches are probably more likely due to his job.  However I am comfortable with him hold the medication for 2 or 3 weeks to see if he notices an improvement.  Otherwise he is doing well.  He does report occasional tickle in his throat.  On examination today, there is no abnormalities in his posterior oropharynx.  He denies any postnasal drip.  He denies any hemoptysis.  He denies any food sticking.  He denies any odynophagia.  He denies any heartburn.  He states that it does not infrequently.  He does not smoke. Past Medical History:  Diagnosis Date   Allergy    occ and very mild    Hyperlipidemia    Hypertension    Past Surgical History:  Procedure Laterality Date   COLONOSCOPY     many years ago- normal per pt- in his 89's   NECK SURGERY     Current Outpatient Medications on File Prior to Visit  Medication Sig Dispense Refill   hydrochlorothiazide (HYDRODIURIL) 25 MG tablet TAKE 1 TABLET (25 MG TOTAL) BY MOUTH DAILY. 90 tablet 1   nebivolol (BYSTOLIC) 10 MG tablet TAKE 1 TABLET BY MOUTH EVERY DAY 30 tablet 0   rosuvastatin (CRESTOR) 20 MG tablet TAKE 1 TABLET BY MOUTH EVERY DAY 90 tablet 1   No current facility-administered medications on file prior to visit.   No Known Allergies Social History    Socioeconomic History   Marital status: Divorced    Spouse name: Not on file   Number of children: Not on file   Years of education: Not on file   Highest education level: Not on file  Occupational History   Not on file  Tobacco Use   Smoking status: Never   Smokeless tobacco: Never  Vaping Use   Vaping Use: Never used  Substance and Sexual Activity   Alcohol use: No    Alcohol/week: 0.0 standard drinks of alcohol   Drug use: No   Sexual activity: Not Currently  Other Topics Concern   Not on file  Social History Narrative   Not on file   Social Determinants of Health   Financial Resource Strain: Not on file  Food Insecurity: Not on file  Transportation Needs: Not on file  Physical Activity: Not on file  Stress: Not on file  Social Connections: Not on file  Intimate Partner Violence: Not on file     Review of Systems  All other systems reviewed and are negative.      Objective:   Physical Exam Vitals reviewed.  Constitutional:      General: He is not in acute distress.    Appearance: Normal  appearance. He is normal weight. He is not ill-appearing or toxic-appearing.  HENT:     Mouth/Throat:     Mouth: Mucous membranes are moist.     Pharynx: Oropharynx is clear. No oropharyngeal exudate or posterior oropharyngeal erythema.  Cardiovascular:     Rate and Rhythm: Normal rate and regular rhythm.     Pulses: Normal pulses.     Heart sounds: Normal heart sounds. No murmur heard.    No friction rub. No gallop.  Pulmonary:     Effort: Pulmonary effort is normal. No respiratory distress.     Breath sounds: Normal breath sounds. No stridor. No wheezing, rhonchi or rales.  Chest:     Chest wall: No tenderness.  Abdominal:     General: Bowel sounds are normal. There is no distension.     Palpations: Abdomen is soft.     Tenderness: There is no abdominal tenderness. There is no guarding.  Musculoskeletal:     Right lower leg: No edema.     Left lower leg: No  edema.  Neurological:     Mental Status: He is alert.           Assessment & Plan:  Benign essential HTN - Plan: CBC with Differential/Platelet, Lipid panel, COMPLETE METABOLIC PANEL WITH GFR  Hyperlipidemia, unspecified hyperlipidemia type - Plan: CBC with Differential/Platelet, Lipid panel, COMPLETE METABOLIC PANEL WITH GFR  Prostate cancer screening - Plan: PSA I suspect the tickle in his throat likely is due to postnasal drip or possibly acid reflux.  At the present time, the patient is not interested in medication.  He declines a referral to ENT for laryngoscopy.  Instead we will just monitor the symptoms and see if they get worse.  I will check a CBC CMP and a lipid panel.  His goal LDL cholesterol is less than 100.  I am very happy with his blood pressure today.  The patient will hold his Crestor for 2 or 3 weeks and see if the muscle aches in his legs get better.  If it does, we can certainly make changes in his medication to hopefully avoid this.  If it does not improve, I suspect the muscle aches are related to his job.

## 2022-06-13 LAB — COMPLETE METABOLIC PANEL WITH GFR
AG Ratio: 1.6 (calc) (ref 1.0–2.5)
ALT: 25 U/L (ref 9–46)
AST: 21 U/L (ref 10–35)
Albumin: 4.4 g/dL (ref 3.6–5.1)
Alkaline phosphatase (APISO): 62 U/L (ref 35–144)
BUN: 12 mg/dL (ref 7–25)
CO2: 28 mmol/L (ref 20–32)
Calcium: 9.7 mg/dL (ref 8.6–10.3)
Chloride: 101 mmol/L (ref 98–110)
Creat: 0.96 mg/dL (ref 0.70–1.30)
Globulin: 2.7 g/dL (calc) (ref 1.9–3.7)
Glucose, Bld: 129 mg/dL — ABNORMAL HIGH (ref 65–99)
Potassium: 3.8 mmol/L (ref 3.5–5.3)
Sodium: 139 mmol/L (ref 135–146)
Total Bilirubin: 0.8 mg/dL (ref 0.2–1.2)
Total Protein: 7.1 g/dL (ref 6.1–8.1)
eGFR: 95 mL/min/{1.73_m2} (ref >=60)

## 2022-06-13 LAB — CBC WITH DIFFERENTIAL/PLATELET
Absolute Monocytes: 905 cells/uL (ref 200–950)
Basophils Absolute: 54 cells/uL (ref 0–200)
Basophils Relative: 0.4 %
Eosinophils Absolute: 81 cells/uL (ref 15–500)
Eosinophils Relative: 0.6 %
HCT: 47.5 % (ref 38.5–50.0)
Hemoglobin: 16.3 g/dL (ref 13.2–17.1)
Lymphs Abs: 1215 cells/uL (ref 850–3900)
MCH: 30.2 pg (ref 27.0–33.0)
MCHC: 34.3 g/dL (ref 32.0–36.0)
MCV: 88 fL (ref 80.0–100.0)
MPV: 8.5 fL (ref 7.5–12.5)
Monocytes Relative: 6.7 %
Neutro Abs: 11246 cells/uL — ABNORMAL HIGH (ref 1500–7800)
Neutrophils Relative %: 83.3 %
Platelets: 247 10*3/uL (ref 140–400)
RBC: 5.4 10*6/uL (ref 4.20–5.80)
RDW: 12.6 % (ref 11.0–15.0)
Total Lymphocyte: 9 %
WBC: 13.5 10*3/uL — ABNORMAL HIGH (ref 3.8–10.8)

## 2022-06-13 LAB — TEST AUTHORIZATION

## 2022-06-13 LAB — LIPID PANEL
Cholesterol: 125 mg/dL (ref <200)
HDL: 38 mg/dL — ABNORMAL LOW (ref >=40)
LDL Cholesterol (Calc): 67 mg/dL (calc)
Non-HDL Cholesterol (Calc): 87 mg/dL (calc) (ref <130)
Total CHOL/HDL Ratio: 3.3 (calc) (ref <5.0)
Triglycerides: 116 mg/dL (ref <150)

## 2022-06-13 LAB — HEMOGLOBIN A1C W/OUT EAG: Hgb A1c MFr Bld: 5.5 % of total Hgb (ref <5.7)

## 2022-06-13 LAB — PSA: PSA: 3.01 ng/mL (ref <=4.00)

## 2022-06-16 ENCOUNTER — Other Ambulatory Visit: Payer: Self-pay | Admitting: Family Medicine

## 2022-06-16 DIAGNOSIS — I10 Essential (primary) hypertension: Secondary | ICD-10-CM

## 2022-08-19 ENCOUNTER — Other Ambulatory Visit: Payer: Self-pay | Admitting: Family Medicine

## 2022-08-19 DIAGNOSIS — I10 Essential (primary) hypertension: Secondary | ICD-10-CM

## 2022-08-19 NOTE — Telephone Encounter (Signed)
Requested medication (s) are due for refill today: For review  Requested medication (s) are on the active medication list: yes    Last refill: 02/12/22 #90  1 refill  Future visit scheduled no  Notes to clinic:Last OV pt was advised to stop Crestor for 2-3 weeks to eval muscle aches. Did not see any F/U. Please review. Thank you.  Requested Prescriptions  Pending Prescriptions Disp Refills   rosuvastatin (CRESTOR) 20 MG tablet [Pharmacy Med Name: ROSUVASTATIN CALCIUM 20 MG TAB] 90 tablet 2    Sig: TAKE 1 TABLET BY MOUTH EVERY DAY     Cardiovascular:  Antilipid - Statins 2 Failed - 08/19/2022  1:00 PM      Failed - Valid encounter within last 12 months    Recent Outpatient Visits           1 year ago Benign essential HTN   [place] Pickard, Cammie Mcgee, MD   1 year ago Prepatellar bursitis of right knee   [place] Pickard, Cammie Mcgee, MD   2 years ago Colon cancer screening   [place] Susy Frizzle, MD   2 years ago Prepatellar bursitis of right knee   [place] Susy Frizzle, MD   2 years ago Pain and swelling of right knee   [place], Crystal A, FNP              Failed - Lipid Panel in normal range within the last 12 months    Cholesterol  Date Value Ref Range Status  06/06/2022 125 <200 mg/dL Final   LDL Cholesterol (Calc)  Date Value Ref Range Status  06/06/2022 67 mg/dL (calc) Final    Comment:    Reference range: <100 . Desirable range <100 mg/dL for primary prevention;   <70 mg/dL for patients with CHD or diabetic patients  with > or = 2 CHD risk factors. Marland Kitchen LDL-C is now calculated using the Martin-Hopkins  calculation, which is a validated novel method providing  better accuracy than the Friedewald equation in the  estimation of LDL-C.  Cresenciano Genre et al. Annamaria Helling. 5409;811(91): 2061-2068  (http://education.[facility].com/faq/FAQ164)    HDL  Date  Value Ref Range Status  06/06/2022 38 (L) > OR = 40 mg/dL Final   Triglycerides  Date Value Ref Range Status  06/06/2022 116 <150 mg/dL Final         Passed - Cr in normal range and within 360 days    Creat  Date Value Ref Range Status  06/06/2022 0.96 0.70 - 1.30 mg/dL Final         Passed - Patient is not pregnant      Signed Prescriptions Disp Refills   hydrochlorothiazide (HYDRODIURIL) 25 MG tablet 90 tablet 2    Sig: TAKE 1 TABLET (25 MG TOTAL) BY MOUTH DAILY.     Cardiovascular: Diuretics - Thiazide Failed - 08/19/2022  1:00 PM      Failed - Valid encounter within last 6 months    Recent Outpatient Visits           1 year ago Benign essential HTN   [place] Pickard, Cammie Mcgee, MD   1 year ago Prepatellar bursitis of right knee   [place] Dennard Schaumann, Cammie Mcgee, MD   2 years ago Colon cancer screening   [place] Susy Frizzle, MD   2 years ago Prepatellar bursitis of right  knee   [place] Susy Frizzle, MD   2 years ago Pain and swelling of right knee   [facility] Annie Main, FNP              Passed - Cr in normal range and within 180 days    Creat  Date Value Ref Range Status  06/06/2022 0.96 0.70 - 1.30 mg/dL Final         Passed - K in normal range and within 180 days    Potassium  Date Value Ref Range Status  06/06/2022 3.8 3.5 - 5.3 mmol/L Final         Passed - Na in normal range and within 180 days    Sodium  Date Value Ref Range Status  06/06/2022 139 135 - 146 mmol/L Final         Passed - Last BP in normal range    BP Readings from Last 1 Encounters:  06/06/22 128/68

## 2022-08-19 NOTE — Telephone Encounter (Signed)
Requested Prescriptions  Pending Prescriptions Disp Refills  . hydrochlorothiazide (HYDRODIURIL) 25 MG tablet [Pharmacy Med Name: HYDROCHLOROTHIAZIDE 25 MG TAB] 90 tablet 2    Sig: TAKE 1 TABLET (25 MG TOTAL) BY MOUTH DAILY.     Cardiovascular: Diuretics - Thiazide Failed - 08/19/2022  1:00 PM      Failed - Valid encounter within last 6 months    Recent Outpatient Visits          1 year ago Benign essential HTN   [place] Pickard, Cammie Mcgee, MD   1 year ago Prepatellar bursitis of right knee   [place] Dennard Schaumann, Cammie Mcgee, MD   2 years ago Colon cancer screening   [place] Susy Frizzle, MD   2 years ago Prepatellar bursitis of right knee   [place] Susy Frizzle, MD   2 years ago Pain and swelling of right knee   [facility] Annie Main, FNP             Passed - Cr in normal range and within 180 days    Creat  Date Value Ref Range Status  06/06/2022 0.96 0.70 - 1.30 mg/dL Final         Passed - K in normal range and within 180 days    Potassium  Date Value Ref Range Status  06/06/2022 3.8 3.5 - 5.3 mmol/L Final         Passed - Na in normal range and within 180 days    Sodium  Date Value Ref Range Status  06/06/2022 139 135 - 146 mmol/L Final         Passed - Last BP in normal range    BP Readings from Last 1 Encounters:  06/06/22 128/68         . rosuvastatin (CRESTOR) 20 MG tablet [Pharmacy Med Name: ROSUVASTATIN CALCIUM 20 MG TAB] 90 tablet 2    Sig: TAKE 1 TABLET BY MOUTH EVERY DAY     Cardiovascular:  Antilipid - Statins 2 Failed - 08/19/2022  1:00 PM      Failed - Valid encounter within last 12 months    Recent Outpatient Visits          1 year ago Benign essential HTN   [place] Pickard, Cammie Mcgee, MD   1 year ago Prepatellar bursitis of right knee   [place] Pickard, Cammie Mcgee, MD   2 years ago Colon cancer  screening   [place] Susy Frizzle, MD   2 years ago Prepatellar bursitis of right knee   [place] Susy Frizzle, MD   2 years ago Pain and swelling of right knee   [place], Crystal A, FNP             Failed - Lipid Panel in normal range within the last 12 months    Cholesterol  Date Value Ref Range Status  06/06/2022 125 <200 mg/dL Final   LDL Cholesterol (Calc)  Date Value Ref Range Status  06/06/2022 67 mg/dL (calc) Final    Comment:    Reference range: <100 . Desirable range <100 mg/dL for primary prevention;   <70 mg/dL for patients with CHD or diabetic patients  with > or = 2 CHD risk factors. Marland Kitchen LDL-C is now calculated using the Martin-Hopkins  calculation, which is a validated novel method  providing  better accuracy than the Friedewald equation in the  estimation of LDL-C.  Cresenciano Genre et al. Annamaria Helling. 8882;800(34): 2061-2068  (http://education.[facility].com/faq/FAQ164)    HDL  Date Value Ref Range Status  06/06/2022 38 (L) > OR = 40 mg/dL Final   Triglycerides  Date Value Ref Range Status  06/06/2022 116 <150 mg/dL Final         Passed - Cr in normal range and within 360 days    Creat  Date Value Ref Range Status  06/06/2022 0.96 0.70 - 1.30 mg/dL Final         Passed - Patient is not pregnant

## 2022-09-26 ENCOUNTER — Other Ambulatory Visit: Payer: Self-pay | Admitting: Family Medicine

## 2022-09-26 DIAGNOSIS — I10 Essential (primary) hypertension: Secondary | ICD-10-CM

## 2022-09-26 MED ORDER — NEBIVOLOL HCL 10 MG PO TABS: 10.0000 mg | ORAL_TABLET | Freq: Every day | ORAL | 3 refills | Status: DC

## 2022-09-26 NOTE — Telephone Encounter (Signed)
Requested Prescriptions  Pending Prescriptions Disp Refills   nebivolol (BYSTOLIC) 10 MG tablet 30 tablet 3    Sig: Take 1 tablet (10 mg total) by mouth daily.     Cardiovascular: Beta Blockers 3 Failed - 09/26/2022  4:08 PM      Failed - Valid encounter within last 6 months    Recent Outpatient Visits           1 year ago Benign essential HTN   [place] Timothy Dean, Timothy Mcgee, MD   2 years ago Prepatellar bursitis of right knee   [place] Timothy Dean, Timothy Mcgee, MD   2 years ago Colon cancer screening   [place] Susy Frizzle, MD   2 years ago Prepatellar bursitis of right knee   [place] Susy Frizzle, MD   2 years ago Pain and swelling of right knee   [place], Matthias Hughs, FNP              Passed - Cr in normal range and within 360 days    Creat  Date Value Ref Range Status  06/06/2022 0.96 0.70 - 1.30 mg/dL Final         Passed - AST in normal range and within 360 days    AST  Date Value Ref Range Status  06/06/2022 21 10 - 35 U/L Final         Passed - ALT in normal range and within 360 days    ALT  Date Value Ref Range Status  06/06/2022 25 9 - 46 U/L Final         Passed - Last BP in normal range    BP Readings from Last 1 Encounters:  06/06/22 128/68         Passed - Last Heart Rate in normal range    Pulse Readings from Last 1 Encounters:  06/06/22 91

## 2022-09-26 NOTE — Telephone Encounter (Signed)
  Prescription Request  09/26/2022  Is this a "Controlled Substance" medicine? No  LOV: 08/19/2022   What is the name of the medication or equipment?   nebivolol (BYSTOLIC) 10 MG tablet [[id number]]   Have you contacted your pharmacy to request a refill? Yes   Which pharmacy would you like this sent to?    [id number] - [address], [id number]: [phone] Fax: [phone] #: [id number]  Patient notified that their request is being sent to the clinical staff for review and that they should receive a response within 2 business days.   Please advise pharmacist at [phone]

## 2022-11-21 ENCOUNTER — Other Ambulatory Visit: Payer: Self-pay | Admitting: Family Medicine

## 2022-11-21 DIAGNOSIS — I10 Essential (primary) hypertension: Secondary | ICD-10-CM

## 2023-06-06 ENCOUNTER — Other Ambulatory Visit: Payer: Self-pay | Admitting: Family Medicine

## 2023-06-06 DIAGNOSIS — I10 Essential (primary) hypertension: Secondary | ICD-10-CM

## 2023-06-08 NOTE — Telephone Encounter (Signed)
Requested medications are due for refill today.  yes  Requested medications are on the active medications list.  yes  Last refill. 08/19/2022 #90 2 rf for both meds  Future visit scheduled.   no  Notes to clinic.  Labs are expired. Pt last seen 06/06/2022.    Requested Prescriptions  Pending Prescriptions Disp Refills   hydrochlorothiazide (HYDRODIURIL) 25 MG tablet [Pharmacy Med Name: HYDROCHLOROTHIAZIDE 25 MG TAB] 90 tablet 2    Sig: TAKE 1 TABLET (25 MG TOTAL) BY MOUTH DAILY.     Cardiovascular: Diuretics - Thiazide Failed - 06/06/2023  6:56 AM      Failed - Cr in normal range and within 180 days    Creat  Date Value Ref Range Status  06/06/2022 0.96 0.70 - 1.30 mg/dL Final         Failed - K in normal range and within 180 days    Potassium  Date Value Ref Range Status  06/06/2022 3.8 3.5 - 5.3 mmol/L Final         Failed - Na in normal range and within 180 days    Sodium  Date Value Ref Range Status  06/06/2022 139 135 - 146 mmol/L Final         Failed - Valid encounter within last 6 months    Recent Outpatient Visits           2 years ago Benign essential HTN   [facility] Donita Brooks, MD   2 years ago Prepatellar bursitis of right knee   [facility] Tanya Nones, Priscille Heidelberg, MD   3 years ago Colon cancer screening   [facility] Tanya Nones, Priscille Heidelberg, MD   3 years ago Prepatellar bursitis of right knee   [facility] Donita Brooks, MD   3 years ago Pain and swelling of right knee   [facility] Jenne Pane, Crystal A, FNP              Passed - Last BP in normal range    BP Readings from Last 1 Encounters:  06/06/22 128/68          rosuvastatin (CRESTOR) 20 MG tablet [Pharmacy Med Name: ROSUVASTATIN CALCIUM 20 MG TAB] 90 tablet 2    Sig: TAKE 1 TABLET BY MOUTH EVERY DAY     Cardiovascular:  Antilipid - Statins 2 Failed - 06/06/2023  6:56 AM      Failed - Cr in normal range  and within 360 days    Creat  Date Value Ref Range Status  06/06/2022 0.96 0.70 - 1.30 mg/dL Final         Failed - Valid encounter within last 12 months    Recent Outpatient Visits           2 years ago Benign essential HTN   [facility] Donita Brooks, MD   2 years ago Prepatellar bursitis of right knee   [facility] Tanya Nones, Priscille Heidelberg, MD   3 years ago Colon cancer screening   [facility] Donita Brooks, MD   3 years ago Prepatellar bursitis of right knee   [facility] Donita Brooks, MD   3 years ago Pain and swelling of right knee   [facility] Jenne Pane, Crystal A, FNP              Failed - Lipid Panel in normal range  within the last 12 months    Cholesterol  Date Value Ref Range Status  06/06/2022 125 <200 mg/dL Final   LDL Cholesterol (Calc)  Date Value Ref Range Status  06/06/2022 67 mg/dL (calc) Final    Comment:    Reference range: <100 . Desirable range <100 mg/dL for primary prevention;   <70 mg/dL for patients with CHD or diabetic patients  with > or = 2 CHD risk factors. Marland Kitchen LDL-C is now calculated using the Martin-Hopkins  calculation, which is a validated novel method providing  better accuracy than the Friedewald equation in the  estimation of LDL-C.  Horald Pollen et al. Lenox Ahr. 1610;960(45): 2061-2068  (http://education.[facility].com/faq/FAQ164)    HDL  Date Value Ref Range Status  06/06/2022 38 (L) > OR = 40 mg/dL Final   Triglycerides  Date Value Ref Range Status  06/06/2022 116 <150 mg/dL Final         Passed - Patient is not pregnant

## 2023-07-07 ENCOUNTER — Telehealth: Payer: Self-pay | Admitting: Family Medicine

## 2023-07-07 ENCOUNTER — Other Ambulatory Visit: Payer: Self-pay

## 2023-07-07 DIAGNOSIS — I10 Essential (primary) hypertension: Secondary | ICD-10-CM

## 2023-07-07 MED ORDER — NEBIVOLOL HCL 10 MG PO TABS: 10.0000 mg | ORAL_TABLET | Freq: Every day | ORAL | 1 refills | Status: DC

## 2023-07-07 NOTE — Telephone Encounter (Signed)
Prescription Request  07/07/2023  LOV: Visit date not found  What is the name of the medication or equipment? nebivolol (BYSTOLIC) 10 MG tablet   Have you contacted your pharmacy to request a refill? Yes   Which pharmacy would you like this sent to?  [id number] [place], [place] - [address] AT [facility] [address] [place] [address] Phone: [phone] Fax: [phone]    Patient notified that their request is being sent to the clinical staff for review and that they should receive a response within 2 business days.   Please advise at [facility] [phone]

## 2023-09-15 ENCOUNTER — Other Ambulatory Visit: Payer: Self-pay | Admitting: Family Medicine

## 2023-09-15 DIAGNOSIS — I10 Essential (primary) hypertension: Secondary | ICD-10-CM

## 2023-09-16 NOTE — Telephone Encounter (Signed)
Requested medication (s) are due for refill today: yes  Requested medication (s) are on the active medication list: yes  Last refill:  06/08/23  Future visit scheduled: no  Notes to clinic:  Unable to refill per protocol, courtesy refill already given, routing for provider approval.      Requested Prescriptions  Pending Prescriptions Disp Refills   rosuvastatin (CRESTOR) 20 MG tablet [Pharmacy Med Name: ROSUVASTATIN CALCIUM 20 MG TAB] 90 tablet 0    Sig: TAKE 1 TABLET BY MOUTH EVERY DAY     Cardiovascular:  Antilipid - Statins 2 Failed - 09/15/2023 12:18 AM      Failed - Cr in normal range and within 360 days    Creat  Date Value Ref Range Status  06/06/2022 0.96 0.70 - 1.30 mg/dL Final         Failed - Valid encounter within last 12 months    Recent Outpatient Visits           2 years ago Benign essential HTN   [facility] Donita Brooks, MD   3 years ago Prepatellar bursitis of right knee   [facility] Pickard, Priscille Heidelberg, MD   3 years ago Colon cancer screening   [facility] Tanya Nones, Priscille Heidelberg, MD   3 years ago Prepatellar bursitis of right knee   [facility] Donita Brooks, MD   3 years ago Pain and swelling of right knee   [facility] Jenne Pane, Crystal A, FNP              Failed - Lipid Panel in normal range within the last 12 months    Cholesterol  Date Value Ref Range Status  06/06/2022 125 <200 mg/dL Final   LDL Cholesterol (Calc)  Date Value Ref Range Status  06/06/2022 67 mg/dL (calc) Final    Comment:    Reference range: <100 . Desirable range <100 mg/dL for primary prevention;   <70 mg/dL for patients with CHD or diabetic patients  with > or = 2 CHD risk factors. Marland Kitchen LDL-C is now calculated using the Martin-Hopkins  calculation, which is a validated novel method providing  better accuracy than the Friedewald equation in the  estimation of LDL-C.  Horald Pollen et  al. Lenox Ahr. 1610;960(45): 2061-2068  (http://education.[facility].com/faq/FAQ164)    HDL  Date Value Ref Range Status  06/06/2022 38 (L) > OR = 40 mg/dL Final   Triglycerides  Date Value Ref Range Status  06/06/2022 116 <150 mg/dL Final         Passed - Patient is not pregnant       hydrochlorothiazide (HYDRODIURIL) 25 MG tablet [Pharmacy Med Name: HYDROCHLOROTHIAZIDE 25 MG TAB] 90 tablet 0    Sig: TAKE 1 TABLET (25 MG TOTAL) BY MOUTH DAILY.     Cardiovascular: Diuretics - Thiazide Failed - 09/15/2023 12:18 AM      Failed - Cr in normal range and within 180 days    Creat  Date Value Ref Range Status  06/06/2022 0.96 0.70 - 1.30 mg/dL Final         Failed - K in normal range and within 180 days    Potassium  Date Value Ref Range Status  06/06/2022 3.8 3.5 - 5.3 mmol/L Final         Failed - Na in normal range and within 180 days    Sodium  Date Value Ref Range Status  06/06/2022 139 135 - 146  mmol/L Final         Failed - Valid encounter within last 6 months    Recent Outpatient Visits           2 years ago Benign essential HTN   [facility] Tanya Nones, Priscille Heidelberg, MD   3 years ago Prepatellar bursitis of right knee   [facility] Tanya Nones, Priscille Heidelberg, MD   3 years ago Colon cancer screening   [facility] Donita Brooks, MD   3 years ago Prepatellar bursitis of right knee   [facility] Medicine Donita Brooks, MD   3 years ago Pain and swelling of right knee   [facility] Jenne Pane, Crystal A, FNP              Passed - Last BP in normal range    BP Readings from Last 1 Encounters:  06/06/22 128/68

## 2024-01-15 ENCOUNTER — Other Ambulatory Visit: Payer: Self-pay

## 2024-01-15 DIAGNOSIS — I10 Essential (primary) hypertension: Secondary | ICD-10-CM

## 2024-01-15 MED ORDER — HYDROCHLOROTHIAZIDE 25 MG PO TABS: ORAL_TABLET | ORAL | 0 refills | Status: AC

## 2024-01-15 MED ORDER — ROSUVASTATIN CALCIUM 20 MG PO TABS: ORAL_TABLET | ORAL | 0 refills | Status: DC

## 2024-01-17 ENCOUNTER — Other Ambulatory Visit: Payer: Self-pay | Admitting: Family Medicine

## 2024-01-17 DIAGNOSIS — I10 Essential (primary) hypertension: Secondary | ICD-10-CM

## 2024-02-16 ENCOUNTER — Other Ambulatory Visit: Payer: Self-pay | Admitting: Family Medicine

## 2024-02-16 DIAGNOSIS — I10 Essential (primary) hypertension: Secondary | ICD-10-CM

## 2024-02-17 NOTE — Telephone Encounter (Signed)
 Requested Prescriptions  Pending Prescriptions Disp Refills   rosuvastatin (CRESTOR) 20 MG tablet [Pharmacy Med Name: ROSUVASTATIN CALCIUM 20 MG TAB] 90 tablet 1    Sig: TAKE 1 TABLET BY MOUTH EVERY DAY     Cardiovascular:  Antilipid - Statins 2 Failed - 02/17/2024  5:08 PM      Failed - Cr in normal range and within 360 days    Creat  Date Value Ref Range Status  06/06/2022 0.96 0.70 - 1.30 mg/dL Final         Failed - Valid encounter within last 12 months    Recent Outpatient Visits           1 year ago Benign essential HTN   [facility] Donita Brooks, MD   8 years ago Hypertensive urgency   [facility], Levell July, MD              Failed - Lipid Panel in normal range within the last 12 months    Cholesterol  Date Value Ref Range Status  06/06/2022 125 <200 mg/dL Final   LDL Cholesterol (Calc)  Date Value Ref Range Status  06/06/2022 67 mg/dL (calc) Final    Comment:    Reference range: <100 . Desirable range <100 mg/dL for primary prevention;   <70 mg/dL for patients with CHD or diabetic patients  with > or = 2 CHD risk factors. Marland Kitchen LDL-C is now calculated using the Martin-Hopkins  calculation, which is a validated novel method providing  better accuracy than the Friedewald equation in the  estimation of LDL-C.  Horald Pollen et al. Lenox Ahr. 0981;191(47): 2061-2068  (http://education.[facility].com/faq/FAQ164)    HDL  Date Value Ref Range Status  06/06/2022 38 (L) > OR = 40 mg/dL Final   Triglycerides  Date Value Ref Range Status  06/06/2022 116 <150 mg/dL Final         Passed - Patient is not pregnant

## 2024-02-25 ENCOUNTER — Other Ambulatory Visit: Payer: Self-pay | Admitting: Family Medicine

## 2024-02-25 DIAGNOSIS — I10 Essential (primary) hypertension: Secondary | ICD-10-CM

## 2024-02-26 NOTE — Telephone Encounter (Signed)
 Requested Prescriptions  Pending Prescriptions Disp Refills   hydrochlorothiazide (HYDRODIURIL) 25 MG tablet [Pharmacy Med Name: HYDROCHLOROTHIAZIDE 25 MG TAB] 30 tablet 0    Sig: TAKE 1 TABLET (25 MG TOTAL) BY MOUTH DAILY.     Cardiovascular: Diuretics - Thiazide Failed - 02/26/2024  1:20 PM      Failed - Cr in normal range and within 180 days    Creat  Date Value Ref Range Status  06/06/2022 0.96 0.70 - 1.30 mg/dL Final         Failed - K in normal range and within 180 days    Potassium  Date Value Ref Range Status  06/06/2022 3.8 3.5 - 5.3 mmol/L Final         Failed - Na in normal range and within 180 days    Sodium  Date Value Ref Range Status  06/06/2022 139 135 - 146 mmol/L Final         Failed - Valid encounter within last 6 months    Recent Outpatient Visits           1 year ago Benign essential HTN   [facility] Donita Brooks, MD   8 years ago Hypertensive urgency   [facility], Levell July, MD              Passed - Last BP in normal range    BP Readings from Last 1 Encounters:  06/06/22 128/68

## 2024-07-28 ENCOUNTER — Telehealth: Payer: Self-pay | Admitting: Family Medicine

## 2024-07-28 NOTE — Telephone Encounter (Signed)
 Refill denied. P.t has not not been seen in office since the year 2023. He will need to at least schedule an in office visit with Dr. Duanne for evaluation before any further medications will be provided.

## 2024-07-28 NOTE — Telephone Encounter (Signed)
 Prescription Request  07/28/2024  LOV: Visit date not found  What is the name of the medication or equipment? nebivolol  (BYSTOLIC ) 10 MG tablet   Have you contacted your pharmacy to request a refill? Yes   Which pharmacy would you like this sent to?  [id number] [place],  - [address] AT [facility] [address] [place] [address] Phone: [phone] Fax: [phone]    Patient notified that their request is being sent to the clinical staff for review and that they should receive a response within 2 business days.   Please advise at [facility] [phone]

## 2024-10-14 ENCOUNTER — Encounter: Payer: Self-pay | Admitting: Family Medicine

## 2024-10-14 ENCOUNTER — Ambulatory Visit: Admitting: Family Medicine

## 2024-10-14 VITALS — BP 140/80 | HR 73 | Ht 70.0 in | Wt 187.6 lb

## 2024-10-14 DIAGNOSIS — E7800 Pure hypercholesterolemia, unspecified: Secondary | ICD-10-CM

## 2024-10-14 DIAGNOSIS — I10 Essential (primary) hypertension: Secondary | ICD-10-CM

## 2024-10-14 DIAGNOSIS — Z125 Encounter for screening for malignant neoplasm of prostate: Secondary | ICD-10-CM

## 2024-10-14 DIAGNOSIS — Z23 Encounter for immunization: Secondary | ICD-10-CM

## 2024-10-14 LAB — CBC WITH DIFFERENTIAL/PLATELET
Absolute Lymphocytes: 1215 {cells}/uL (ref 850–3900)
Absolute Monocytes: 419 {cells}/uL (ref 200–950)
Basophils Absolute: 30 {cells}/uL (ref 0–200)
Basophils Relative: 0.5 %
Eosinophils Absolute: 83 {cells}/uL (ref 15–500)
Eosinophils Relative: 1.4 %
HCT: 47 % (ref 38.5–50.0)
Hemoglobin: 16.2 g/dL (ref 13.2–17.1)
MCH: 30.7 pg (ref 27.0–33.0)
MCHC: 34.5 g/dL (ref 32.0–36.0)
MCV: 89 fL (ref 80.0–100.0)
MPV: 9.3 fL (ref 7.5–12.5)
Monocytes Relative: 7.1 %
Neutro Abs: 4154 {cells}/uL (ref 1500–7800)
Neutrophils Relative %: 70.4 %
Platelets: 273 Thousand/uL (ref 140–400)
RBC: 5.28 Million/uL (ref 4.20–5.80)
RDW: 12.4 % (ref 11.0–15.0)
Total Lymphocyte: 20.6 %
WBC: 5.9 Thousand/uL (ref 3.8–10.8)

## 2024-10-14 LAB — COMPREHENSIVE METABOLIC PANEL WITH GFR
AG Ratio: 2.1 (calc) (ref 1.0–2.5)
ALT: 22 U/L (ref 9–46)
AST: 28 U/L (ref 10–35)
Albumin: 4.9 g/dL (ref 3.6–5.1)
Alkaline phosphatase (APISO): 72 U/L (ref 35–144)
BUN: 12 mg/dL (ref 7–25)
CO2: 28 mmol/L (ref 20–32)
Calcium: 9.6 mg/dL (ref 8.6–10.3)
Chloride: 102 mmol/L (ref 98–110)
Creat: 0.89 mg/dL (ref 0.70–1.30)
Globulin: 2.3 g/dL (ref 1.9–3.7)
Glucose, Bld: 115 mg/dL — ABNORMAL HIGH (ref 65–99)
Potassium: 4.8 mmol/L (ref 3.5–5.3)
Sodium: 138 mmol/L (ref 135–146)
Total Bilirubin: 0.9 mg/dL (ref 0.2–1.2)
Total Protein: 7.2 g/dL (ref 6.1–8.1)
eGFR: 102 mL/min/1.73m2 (ref >=60)

## 2024-10-14 LAB — LIPID PANEL
Cholesterol: 120 mg/dL (ref <200)
HDL: 38 mg/dL — ABNORMAL LOW (ref >=40)
LDL Cholesterol (Calc): 66 mg/dL
Non-HDL Cholesterol (Calc): 82 mg/dL (ref <130)
Total CHOL/HDL Ratio: 3.2 (calc) (ref <5.0)
Triglycerides: 76 mg/dL (ref <150)

## 2024-10-14 LAB — PSA: PSA: 4.19 ng/mL — ABNORMAL HIGH (ref <=4.00)

## 2024-10-14 NOTE — Progress Notes (Signed)
 Subjective:    Patient ID: Timothy Dean, male    DOB: 1971-08-30, 53 y.o.   MRN: [id number]  HPI  Patient is here today for a checkup.  He is not taking hydrochlorothiazide  however he is taking Bystolic  and Crestor .  He denies any chest pain or shortness of breath or dyspnea on exertion.  His last colonoscopy was in 2021 and is up-to-date.  He is due for prostate cancer screening.  He is due for a flu shot which he politely declines.  He is also due for a shingles shot and a pneumonia shot both of which he politely declines.  We discussed his tetanus shot and he would like to get that today.  Otherwise he is doing well. Past Medical History:  Diagnosis Date   Allergy    occ and very mild    Hyperlipidemia    Hypertension    Past Surgical History:  Procedure Laterality Date   COLONOSCOPY     many years ago- normal per pt- in his 47's   NECK SURGERY     Current Outpatient Medications on File Prior to Visit  Medication Sig Dispense Refill   hydrochlorothiazide  (HYDRODIURIL ) 25 MG tablet TAKE 1 TABLET (25 MG TOTAL) BY MOUTH DAILY. (Patient not taking: Reported on 10/14/2024) 30 tablet 0   nebivolol  (BYSTOLIC ) 10 MG tablet TAKE 1 TABLET BY MOUTH EVERY DAY 90 tablet 1   rosuvastatin  (CRESTOR ) 20 MG tablet TAKE 1 TABLET BY MOUTH EVERY DAY 30 tablet 0   No current facility-administered medications on file prior to visit.   No Known Allergies Social History   Socioeconomic History   Marital status: Divorced    Spouse name: Not on file   Number of children: Not on file   Years of education: Not on file   Highest education level: Not on file  Occupational History   Not on file  Tobacco Use   Smoking status: Never   Smokeless tobacco: Never  Vaping Use   Vaping status: Never Used  Substance and Sexual Activity   Alcohol use: No    Alcohol/week: 0.0 standard drinks of alcohol   Drug use: No   Sexual activity: Not Currently  Other Topics Concern   Not on file  Social  History Narrative   Not on file   Social Drivers of Health   Financial Resource Strain: Not on file  Food Insecurity: Not on file  Transportation Needs: Not on file  Physical Activity: Not on file  Stress: Not on file  Social Connections: Not on file  Intimate Partner Violence: Not on file     Review of Systems  All other systems reviewed and are negative.      Objective:   Physical Exam Vitals reviewed.  Constitutional:      General: He is not in acute distress.    Appearance: Normal appearance. He is normal weight. He is not ill-appearing or toxic-appearing.  HENT:     Mouth/Throat:     Mouth: Mucous membranes are moist.     Pharynx: Oropharynx is clear. No oropharyngeal exudate or posterior oropharyngeal erythema.  Cardiovascular:     Rate and Rhythm: Normal rate and regular rhythm.     Pulses: Normal pulses.     Heart sounds: Normal heart sounds. No murmur heard.    No friction rub. No gallop.  Pulmonary:     Effort: Pulmonary effort is normal. No respiratory distress.     Breath sounds: Normal breath sounds. No stridor.  No wheezing, rhonchi or rales.  Chest:     Chest wall: No tenderness.  Abdominal:     General: Bowel sounds are normal. There is no distension.     Palpations: Abdomen is soft.     Tenderness: There is no abdominal tenderness. There is no guarding.  Musculoskeletal:     Right lower leg: No edema.     Left lower leg: No edema.  Neurological:     Mental Status: He is alert.           Assessment & Plan:  Benign essential HTN - Plan: CBC with Differential/Platelet, Comprehensive metabolic panel with GFR, Lipid panel  Pure hypercholesterolemia - Plan: CBC with Differential/Platelet, Comprehensive metabolic panel with GFR, Lipid panel  Prostate cancer screening - Plan: PSA  Blood pressure today is borderline.  Of asked him to keep an eye on that at home.  I like to see it less than 140/90 consistently.  I will check his cholesterol today.   His goal LDL cholesterol is less than 100.  I will screen for prostate cancer with a PSA.  Recommended a flu shot, shingles vaccine, and a pneumonia vaccine all of which the patient declined.  He did request a tetanus shot today.  We also discussed a coronary artery calcium  score but at the present time he declines this.  He denies any symptoms of angina

## 2024-10-14 NOTE — Addendum Note (Signed)
 Addended by: ANGELENA RONAL BRADLEY K on: 10/14/2024 04:27 PM   Modules accepted: Orders

## 2024-10-17 ENCOUNTER — Ambulatory Visit: Payer: Self-pay | Admitting: Family Medicine

## 2024-10-19 ENCOUNTER — Other Ambulatory Visit: Payer: Self-pay

## 2024-10-19 DIAGNOSIS — R972 Elevated prostate specific antigen [PSA]: Secondary | ICD-10-CM

## 2024-10-22 ENCOUNTER — Other Ambulatory Visit: Payer: Self-pay | Admitting: Family Medicine

## 2024-10-22 DIAGNOSIS — I10 Essential (primary) hypertension: Secondary | ICD-10-CM

## 2024-10-24 ENCOUNTER — Telehealth: Payer: Self-pay | Admitting: Family Medicine

## 2024-10-24 DIAGNOSIS — I10 Essential (primary) hypertension: Secondary | ICD-10-CM

## 2024-10-24 NOTE — Telephone Encounter (Addendum)
 Copied from CRM #[id number]. Topic: Clinical - Medication Refill >> Oct 24, 2024 12:08 PM Tiffany B wrote: Medication: Patient is going out of town on Thursday and would like request expedited,  nebivolol  (BYSTOLIC ) 10 MG tablet  Has the patient contacted their pharmacy? Yes, contact PCP office    This is the patient's preferred pharmacy:  [id number] [place], [place] - [address] [facility] [address]  [place] [address] Phone: [phone] Fax: [phone]  Is this the correct pharmacy for this prescription? Yes If no, delete pharmacy and type the correct one.   Has the prescription been filled recently? Yes  Is the patient out of the medication? Yes  Has the patient been seen for an appointment in the last year OR does the patient have an upcoming appointment? Yes  Can we respond through MyChart? No  Agent: Please be advised that Rx refills may take up to 3 business days. We ask that you follow-up with your pharmacy.

## 2024-10-25 ENCOUNTER — Other Ambulatory Visit: Payer: Self-pay | Admitting: Family Medicine

## 2024-10-25 DIAGNOSIS — I10 Essential (primary) hypertension: Secondary | ICD-10-CM

## 2024-10-25 NOTE — Telephone Encounter (Unsigned)
 Copied from CRM #[id number]. Topic: Clinical - Medication Refill >> Oct 24, 2024 12:08 PM Tiffany B wrote: Medication: Patient is going out of town on Thursday  nebivolol  (BYSTOLIC ) 10 MG tablet  Has the patient contacted their pharmacy? Yes, contact PCP office    This is the patient's preferred pharmacy:  [id number] [place], [place] - [address] AT [facility] [address] [place] [address] Phone: [phone] Fax: [phone]  Is this the correct pharmacy for this prescription? Yes If no, delete pharmacy and type the correct one.   Has the prescription been filled recently? Yes  Is the patient out of the medication? Yes  Has the patient been seen for an appointment in the last year OR does the patient have an upcoming appointment? Yes  Can we respond through MyChart? No  Agent: Please be advised that Rx refills may take up to 3 business days. We ask that you follow-up with your pharmacy. >> Oct 25, 2024  3:33 PM Antony RAMAN wrote: Leaving out of town Thursday and wants his meds refilled before he leaves thursday

## 2024-10-26 ENCOUNTER — Other Ambulatory Visit: Payer: Self-pay

## 2024-10-26 ENCOUNTER — Telehealth: Payer: Self-pay

## 2024-10-26 DIAGNOSIS — I10 Essential (primary) hypertension: Secondary | ICD-10-CM

## 2024-10-26 MED ORDER — NEBIVOLOL HCL 10 MG PO TABS: 10.0000 mg | ORAL_TABLET | Freq: Every day | ORAL | 1 refills | Status: AC

## 2024-10-26 NOTE — Telephone Encounter (Signed)
 Refills sent. Mjp,lpn  Copied from CRM #[id number]. Topic: Clinical - Medication Refill >> Oct 24, 2024 12:08 PM Tiffany B wrote: Medication: Patient is going out of town on Thursday  nebivolol  (BYSTOLIC ) 10 MG tablet  Has the patient contacted their pharmacy? Yes, contact PCP office    This is the patient's preferred pharmacy:  [id number] [place], [place] - [address] AT [facility] [address] [place] [address] Phone: [phone] Fax: [phone]  Is this the correct pharmacy for this prescription? Yes If no, delete pharmacy and type the correct one.   Has the prescription been filled recently? Yes  Is the patient out of the medication? Yes  Has the patient been seen for an appointment in the last year OR does the patient have an upcoming appointment? Yes  Can we respond through MyChart? No  Agent: Please be advised that Rx refills may take up to 3 business days. We ask that you follow-up with your pharmacy. >> Oct 26, 2024  3:25 PM Joesph B wrote: Patient leaves tomorrow afternoon and needs medication, he said if this can't be filled by tomorrow.. can he pick up samples from the office?  >> Oct 25, 2024  3:33 PM Antony S wrote: Leaving out of town Thursday and wants his meds refilled before he leaves thursday

## 2024-10-27 NOTE — Telephone Encounter (Signed)
 [facility]. Pharmacy closed for lunch. Receipt confirmed by pharmacy 10/26/24 at 4:47 pm.

## 2024-10-27 NOTE — Telephone Encounter (Signed)
 Duplicate request, Rx 10/26/24 90 1RF Requested Prescriptions  Pending Prescriptions Disp Refills   nebivolol  (BYSTOLIC ) 10 MG tablet 90 tablet 1    Sig: Take 1 tablet (10 mg total) by mouth daily.     Cardiovascular: Beta Blockers 3 Failed - 10/27/2024  1:44 PM      Failed - Last BP in normal range    BP Readings from Last 1 Encounters:  10/14/24 (!) 140/80         Passed - Cr in normal range and within 360 days    Creat  Date Value Ref Range Status  10/14/2024 0.89 0.70 - 1.30 mg/dL Final         Passed - AST in normal range and within 360 days    AST  Date Value Ref Range Status  10/14/2024 28 10 - 35 U/L Final         Passed - ALT in normal range and within 360 days    ALT  Date Value Ref Range Status  10/14/2024 22 9 - 46 U/L Final         Passed - Last Heart Rate in normal range    Pulse Readings from Last 1 Encounters:  10/14/24 73         Passed - Valid encounter within last 6 months    Recent Outpatient Visits           1 week ago Benign essential HTN   [facility] Duanne Butler DASEN, MD   2 years ago Benign essential HTN   [facility] Duanne, Butler DASEN, MD   8 years ago Hypertensive urgency   [facility] [place] [facility], Elyn SAILOR, MD       Future Appointments             In 1 month McKenzie, Belvie CROME, MD [facility] [place]

## 2024-10-27 NOTE — Telephone Encounter (Signed)
 Requested by patient. Receipt confirmed by pharmacy 10/26/24 at 4:47 pm. Duplicate request. Rx signed 10/26/24.  Requested Prescriptions  Refused Prescriptions Disp Refills   nebivolol  (BYSTOLIC ) 10 MG tablet 90 tablet 1    Sig: Take 1 tablet (10 mg total) by mouth daily.     Cardiovascular: Beta Blockers 3 Failed - 10/27/2024  1:50 PM      Failed - Last BP in normal range    BP Readings from Last 1 Encounters:  10/14/24 (!) 140/80         Passed - Cr in normal range and within 360 days    Creat  Date Value Ref Range Status  10/14/2024 0.89 0.70 - 1.30 mg/dL Final         Passed - AST in normal range and within 360 days    AST  Date Value Ref Range Status  10/14/2024 28 10 - 35 U/L Final         Passed - ALT in normal range and within 360 days    ALT  Date Value Ref Range Status  10/14/2024 22 9 - 46 U/L Final         Passed - Last Heart Rate in normal range    Pulse Readings from Last 1 Encounters:  10/14/24 73         Passed - Valid encounter within last 6 months    Recent Outpatient Visits           1 week ago Benign essential HTN   [facility] Duanne Butler DASEN, MD   2 years ago Benign essential HTN   [facility] Duanne, Butler DASEN, MD   8 years ago Hypertensive urgency   [facility] [place] [facility], Elyn SAILOR, MD       Future Appointments             In 1 month McKenzie, Belvie CROME, MD [facility] [place]

## 2024-11-18 ENCOUNTER — Telehealth: Payer: Self-pay | Admitting: Family Medicine

## 2024-11-18 ENCOUNTER — Other Ambulatory Visit: Payer: Self-pay

## 2024-11-18 DIAGNOSIS — I10 Essential (primary) hypertension: Secondary | ICD-10-CM

## 2024-11-18 MED ORDER — ROSUVASTATIN CALCIUM 20 MG PO TABS: 20.0000 mg | ORAL_TABLET | Freq: Every day | ORAL | 0 refills | Status: AC

## 2024-11-18 NOTE — Telephone Encounter (Signed)
 Sent in medication

## 2024-11-18 NOTE — Telephone Encounter (Signed)
 Prescription Request  11/18/2024  LOV: 10/14/2024  What is the name of the medication or equipment? rosuvastatin  (CRESTOR ) 20 MG tablet   Have you contacted your pharmacy to request a refill? Yes   Which pharmacy would you like this sent to?  [id number] [place], [place] - [address] AT [facility] [address] [place] [address] Phone: [phone] Fax: [phone]    Patient notified that their request is being sent to the clinical staff for review and that they should receive a response within 2 business days.   Please advise at [facility] [phone]

## 2024-12-14 ENCOUNTER — Encounter: Payer: Self-pay | Admitting: Urology

## 2024-12-14 ENCOUNTER — Ambulatory Visit: Admitting: Urology

## 2024-12-14 VITALS — BP 135/77 | HR 71

## 2024-12-14 DIAGNOSIS — R972 Elevated prostate specific antigen [PSA]: Secondary | ICD-10-CM

## 2024-12-14 LAB — URINALYSIS, ROUTINE W REFLEX MICROSCOPIC
Bilirubin, UA: NEGATIVE
Glucose, UA: NEGATIVE
Ketones, UA: NEGATIVE
Leukocytes,UA: NEGATIVE
Nitrite, UA: NEGATIVE
Protein,UA: NEGATIVE
RBC, UA: NEGATIVE
Specific Gravity, UA: 1.03 (ref 1.005–1.030)
Urobilinogen, Ur: 0.2 mg/dL (ref 0.2–1.0)
pH, UA: 5.5 (ref 5.0–7.5)

## 2024-12-14 MED ORDER — LEVOFLOXACIN 750 MG PO TABS: 750.0000 mg | ORAL_TABLET | Freq: Every day | ORAL | 0 refills | Status: AC

## 2024-12-14 NOTE — Progress Notes (Unsigned)
 "  12/14/2024 8:45 AM   Timothy Dean [birth year] [id number]  Referring provider: Duanne Dean DASEN, MD [address] [place],  [place] [address]  Elevated PSA  HPI: Timothy Dean is a 54yo here for evaluation of elevated PSA. PSa 4.2 in 09/2024 up from 3.0 two years ago. Father had prostate cancer diagnosed in his 55s. NO significant LUTS. IPSS 4 QOL 1 on no BPH therapy. Uirne stream strong. Rare straining to urinate. Nocturia 1x.    PMH: Past Medical History:  Diagnosis Date   Allergy    occ and very mild    Hyperlipidemia    Hypertension     Surgical History: Past Surgical History:  Procedure Laterality Date   COLONOSCOPY     many years ago- normal per pt- in his 20's   NECK SURGERY      Home Medications:  Allergies as of 12/14/2024   No Known Allergies      Medication List        Accurate as of December 14, 2024  8:45 AM. If you have any questions, ask your nurse or doctor.          hydrochlorothiazide  25 MG tablet Commonly known as: HYDRODIURIL  TAKE 1 TABLET (25 MG TOTAL) BY MOUTH DAILY.   nebivolol  10 MG tablet Commonly known as: BYSTOLIC  Take 1 tablet (10 mg total) by mouth daily.   rosuvastatin  20 MG tablet Commonly known as: CRESTOR  Take 1 tablet (20 mg total) by mouth daily.        Allergies: Allergies[1]  Family History: Family History  Problem Relation Age of Onset   Diabetes Father    Hyperlipidemia Father    Colon cancer Neg Hx    Colon polyps Neg Hx    Esophageal cancer Neg Hx    Rectal cancer Neg Hx    Stomach cancer Neg Hx     Social History:  reports that he has never smoked. He has never used smokeless tobacco. He reports that he does not drink alcohol and does not use drugs.  ROS: All other review of systems were reviewed and are negative except what is noted above in HPI  Physical Exam: BP 135/77   Pulse 71   Constitutional:  Alert and oriented, No acute distress. HEENT: [place] AT, moist mucus membranes.   Trachea midline, no masses. Cardiovascular: No clubbing, cyanosis, or edema. Respiratory: Normal respiratory effort, no increased work of breathing. GI: Abdomen is soft, nontender, nondistended, no abdominal masses GU: No CVA tenderness. Circumcised phallus. No masses/lesions on penis, testis, scrotum. Prostate 40g smooth no nodules no induration.  Lymph: No cervical or inguinal lymphadenopathy. Skin: No rashes, bruises or suspicious lesions. Neurologic: Grossly intact, no focal deficits, moving all 4 extremities. Psychiatric: Normal mood and affect.  Laboratory Data: Lab Results  Component Value Date   WBC 5.9 10/14/2024   HGB 16.2 10/14/2024   HCT 47.0 10/14/2024   MCV 89.0 10/14/2024   PLT 273 10/14/2024    Lab Results  Component Value Date   CREATININE 0.89 10/14/2024    Lab Results  Component Value Date   PSA 4.19 (H) 10/14/2024   PSA 3.01 06/06/2022   PSA 2.4 06/01/2020    No results found for: TESTOSTERONE  Lab Results  Component Value Date   HGBA1C 5.5 06/06/2022    Urinalysis    Component Value Date/Time   COLORURINE YELLOW 10/12/2017 1645   APPEARANCEUR CLEAR 10/12/2017 1645   LABSPEC 1.025 10/12/2017 1645   PHURINE  5.5 10/12/2017 1645   GLUCOSEU NEGATIVE 10/12/2017 1645   HGBUR NEGATIVE 10/12/2017 1645   BILIRUBINUR negative 02/02/2016 1537   KETONESUR NEGATIVE 10/12/2017 1645   PROTEINUR NEGATIVE 10/12/2017 1645   UROBILINOGEN 0.2 02/02/2016 1537   NITRITE NEGATIVE 10/12/2017 1645   LEUKOCYTESUR NEGATIVE 10/12/2017 1645    No results found for: LABMICR, WBCUA, RBCUA, LABEPIT, MUCUS, BACTERIA  Pertinent Imaging: *** No results found for this or any previous visit.  No results found for this or any previous visit.  No results found for this or any previous visit.  No results found for this or any previous visit.  No results found for this or any previous visit.  No results found for this or any previous visit.  No results  found for this or any previous visit.  No results found for this or any previous visit.   Assessment & Plan:    1. Elevated PSA (Primary) The patient and I talked about etiologies of elevated PSA.  We discussed the possible relationship between elevated PSA, prostate cancer, BPH, prostatitis, and UTI.   Conservative treatment of elevated PSA with watchful waiting was discussed with the patient.  All questions were answered.        All of the risks and benefits along with alternatives to prostate biopsy were discussed with the patient.  The patient gave fully informed consent to proceed with a transrectal ultrasound guided biopsy of the prostate for the evaluation of their evated PSA.  Prostate biopsy instructions and antibiotics were given to the patient.   - Urinalysis, Routine w reflex microscopic   No follow-ups on file.  Belvie Clara, MD  [facility] [place]      [1] No Known Allergies  "

## 2024-12-14 NOTE — Patient Instructions (Signed)
 Prostate Biopsy Instructions:  Appointment Time: 8:30 am Appointment Date: 01/02/2025 Location: [facility]   Please note this procedure is completed at [facility]. Please arrive 15 minutes prior to your scheduled visit time to allow registration time. If you are late for your scheduled visit time, you may be asked to reschedule due to time restraints.  You will need to stop all aspirin or blood thinners (aspirin, plavix, coumadin, warfarin, motrin, ibuprofen, advil, aleve, naproxen, naprosyn) for 7 days prior to the procedure.  If you have any questions about stopping these medications, please contact your prescribing doctor.  Our office will obtain clearance for your blood thinners to be held- please do not stop these medications until you have been given instruction to do so.  Having a light meal prior to the procedure is recommended.  If you are diabetic or have low blood sugar please bring a small snack or glucose tablet.  A Fleets enema is needed to be purchased over the counter at a local pharmacy and used 2 hours before you scheduled appointment.  This can be purchased over the counter at any pharmacy.  One antibiotic tablet has been sent to your pharmacy. Please pick this prescription up and take 2 hours prior to your scheduled biopsy appointment. You will also be given an additional antibiotic injection at the biopsy appointment.   Please bring someone with you to the procedure to drive you home if you are given a valium to take prior to your procedure.   If you have any questions or concerns, please feel free to call the office at [phone] or send a Mychart message.  What you can expect after your prostate biopsy:  You may notice blood in your urine or stool up to 1 week after your procedure. You may notice blood in your semen up to 1 month after your procedure.  If you are unable to pee afterwards or develop a fever, please call  our office at [phone] and hit option [phone] to speak with a clinical staff member.  Your physicain will notify you of your results.    Thank you, [facility]

## 2024-12-30 ENCOUNTER — Telehealth: Payer: Self-pay | Admitting: Urology

## 2024-12-30 NOTE — Telephone Encounter (Signed)
 SABRA

## 2025-01-02 ENCOUNTER — Ambulatory Visit (HOSPITAL_COMMUNITY)

## 2025-01-02 ENCOUNTER — Other Ambulatory Visit: Admitting: Urology

## 2025-01-16 ENCOUNTER — Ambulatory Visit: Admitting: Urology
